# Patient Record
Sex: Female | Born: 1965 | Race: White | Hispanic: No | Marital: Married | Smoking: Former smoker
Health system: Southern US, Community
[De-identification: ages and names within clinical notes are randomized; demographics above are authoritative.]

## PROBLEM LIST (undated history)

## (undated) DIAGNOSIS — J302 Other seasonal allergic rhinitis: Secondary | ICD-10-CM

## (undated) DIAGNOSIS — S32000A Wedge compression fracture of unspecified lumbar vertebra, initial encounter for closed fracture: Secondary | ICD-10-CM

## (undated) HISTORY — PX: WISDOM TOOTH EXTRACTION: SHX21

## (undated) HISTORY — DX: Wedge compression fracture of unspecified lumbar vertebra, initial encounter for closed fracture: S32.000A

## (undated) HISTORY — DX: Other seasonal allergic rhinitis: J30.2

---

## 2005-07-11 ENCOUNTER — Other Ambulatory Visit: Admission: RE | Admit: 2005-07-11 | Discharge: 2005-07-11 | Payer: Self-pay | Admitting: Obstetrics and Gynecology

## 2005-07-19 ENCOUNTER — Ambulatory Visit (HOSPITAL_COMMUNITY): Admission: RE | Admit: 2005-07-19 | Discharge: 2005-07-19 | Payer: Self-pay | Admitting: Obstetrics and Gynecology

## 2005-07-19 ENCOUNTER — Encounter (INDEPENDENT_AMBULATORY_CARE_PROVIDER_SITE_OTHER): Payer: Self-pay | Admitting: *Deleted

## 2005-12-29 ENCOUNTER — Ambulatory Visit (HOSPITAL_COMMUNITY): Admission: RE | Admit: 2005-12-29 | Discharge: 2005-12-29 | Payer: Self-pay | Admitting: Obstetrics and Gynecology

## 2006-02-23 ENCOUNTER — Ambulatory Visit (HOSPITAL_COMMUNITY): Admission: RE | Admit: 2006-02-23 | Discharge: 2006-02-23 | Payer: Self-pay | Admitting: Obstetrics

## 2006-03-27 ENCOUNTER — Inpatient Hospital Stay (HOSPITAL_COMMUNITY): Admission: AD | Admit: 2006-03-27 | Discharge: 2006-03-27 | Payer: Self-pay | Admitting: Obstetrics and Gynecology

## 2006-05-25 ENCOUNTER — Inpatient Hospital Stay (HOSPITAL_COMMUNITY): Admission: AD | Admit: 2006-05-25 | Discharge: 2006-05-26 | Payer: Self-pay | Admitting: Obstetrics and Gynecology

## 2006-06-07 ENCOUNTER — Encounter (INDEPENDENT_AMBULATORY_CARE_PROVIDER_SITE_OTHER): Payer: Self-pay | Admitting: Obstetrics and Gynecology

## 2006-06-07 ENCOUNTER — Inpatient Hospital Stay (HOSPITAL_COMMUNITY): Admission: AD | Admit: 2006-06-07 | Discharge: 2006-06-10 | Payer: Self-pay | Admitting: Obstetrics and Gynecology

## 2006-07-19 ENCOUNTER — Other Ambulatory Visit: Admission: RE | Admit: 2006-07-19 | Discharge: 2006-07-19 | Payer: Self-pay | Admitting: Obstetrics and Gynecology

## 2007-03-28 ENCOUNTER — Observation Stay (HOSPITAL_COMMUNITY): Admission: AD | Admit: 2007-03-28 | Discharge: 2007-03-29 | Payer: Self-pay | Admitting: Unknown Physician Specialty

## 2007-04-23 ENCOUNTER — Encounter (INDEPENDENT_AMBULATORY_CARE_PROVIDER_SITE_OTHER): Payer: Self-pay | Admitting: Obstetrics and Gynecology

## 2007-04-23 ENCOUNTER — Inpatient Hospital Stay (HOSPITAL_COMMUNITY): Admission: AD | Admit: 2007-04-23 | Discharge: 2007-04-26 | Payer: Self-pay | Admitting: Obstetrics and Gynecology

## 2007-06-04 HISTORY — PX: ESSURE TUBAL LIGATION: SUR464

## 2008-08-02 ENCOUNTER — Emergency Department (HOSPITAL_COMMUNITY): Admission: EM | Admit: 2008-08-02 | Discharge: 2008-08-02 | Payer: Self-pay | Admitting: Emergency Medicine

## 2010-01-24 ENCOUNTER — Encounter: Payer: Self-pay | Admitting: Obstetrics and Gynecology

## 2010-04-11 LAB — POCT URINALYSIS DIP (DEVICE)
Ketones, ur: 80 mg/dL — AB
Protein, ur: 100 mg/dL — AB
Specific Gravity, Urine: 1.015 (ref 1.005–1.030)
Urobilinogen, UA: >=8 mg/dL (ref 0.0–1.0)
pH: 7.5 (ref 5.0–8.0)

## 2010-05-18 NOTE — Discharge Summary (Signed)
Nancy Stevenson, Nancy Stevenson                ACCOUNT NO.:  [id number]   MEDICAL RECORD NO.:  [id number]          PATIENT TYPE:  INP   LOCATION:  9106                          FACILITY:  [facility]   PHYSICIAN:  James A. Ashley Royalty, M.D.DATE OF BIRTH:  May 16, 1965   DATE OF ADMISSION:  06/07/2006  DATE OF DISCHARGE:  06/10/2006                               DISCHARGE SUMMARY   DISCHARGE DIAGNOSIS:  1. Intrauterine pregnancy at 37 weeks 5 days gestation, delivered.  2. Previous cesarean section x2.  3. Premature rupture of membranes.   OPERATIONS SPECIAL PROCEDURES:  Repeat low transverse cesarean section.   CONSULTATIONS:  None.   DISCHARGE MEDICATIONS:  Motrin 60 mg.   HISTORY AND PHYSICAL:  This is a 40-year gravida 3, para 1 at 43 weeks'  gestation.  Prenatal care was complicated by advanced maternal age for  which the patient had an amniocentesis which revealed a normal fetus  chromosomally.  The patient also had a history of previous cesarean  section x2.  She presented on the day of admission complaining of  ruptured membranes at 6:50 a.m.  Contractions ensued.  Vaginal  examination per R.N. revealed 1 cm dilatation, 80% effacement, -1  station, vertex presentation.   HISTORY AND PHYSICAL:  Please see chart.   HOSPITAL COURSE:  The patient was admitted to [facility]  [place].  Admission laboratory studies were drawn.  She was taken to  the operating room on June 07, 2006 and underwent repeat low transverse  cesarean section.  The procedure was performed by Dr. Sylvester Harder and  yielded 7 pounds 4 ounces female Apgars 9 at 1 minute and 10 at 5 minutes  sent to newborn nursery.  There were no complications.  The patient's  postoperative course was characterized by an asymptomatic anemia.  There  was also noted a small amount of serous drainage from the incision with  no overt infection or other complication was observed.  On June 7, the  patient was felt to be a candidate for  discharge and was discharged home  afebrile and in satisfactory condition on the aforementioned medication.   DISPOSITION:  The patient is to return to [facility]  in 6 weeks for postpartum evaluation.      James A. Ashley Royalty, M.D.  Electronically Signed     JAM/MEDQ  D:  07/12/2006  T:  07/13/2006  Job:  [id number]

## 2010-05-18 NOTE — Op Note (Signed)
Nancy Stevenson, Nancy Stevenson                ACCOUNT NO.:  [id number]   MEDICAL RECORD NO.:  [id number]          PATIENT TYPE:  INP   LOCATION:  9106                          [facility]  [facility]   PHYSICIAN:  James A. Ashley Royalty, M.D.DATE OF BIRTH:  1965/05/02   DATE OF PROCEDURE:  06/07/2006  DATE OF DISCHARGE:                               OPERATIVE REPORT   PREOPERATIVE DIAGNOSES:  1. Intrauterine pregnancy at 37 plus weeks' gestation.  2. Premature rupture of membranes.  3. Labor.  4. Previous cesarean section.   POSTOPERATIVE DIAGNOSES:  1. Intrauterine pregnancy at 37 plus weeks' gestation.  2. Premature rupture of membranes.  3. Labor.  4. Previous cesarean section.   PROCEDURE:  Repeat low transverse Cesarean section.   SURGEON:  Rudy Jew. Ashley Royalty, M.D.   ANESTHESIA:  Spinal.   FINDINGS:  A 7-pound 4-ounce female, Apgars 9 at one minute and 10 at five  minutes, sent to the newborn nursery.   ESTIMATED BLOOD LOSS:  900 mL.   COMPLICATIONS:  None.   PACKS AND DRAINS:  Foley.   Sponge, needle and instrument count were reported as correct x2.   PROCEDURE:  The patient was taken to the operating room and placed in  the sitting position. After spinal anesthetic was administered, she was  placed in the dorsal supine position and prepped and draped in the usual  manner for abdominal surgery. Foley catheter was placed. A Pfannenstiel  incision was made down to the level of the fascia which was nicked with  a knife and incised transversely with Mayo scissors. The underlying  rectus muscles were separated from the fascia using sharp and blunt  dissection. The rectus muscles were separated in the midline, exposing  the peritoneum which was elevated with hemostats and entered  atraumatically with Metzenbaum scissors. Incision was extended  longitudinally. The uterus was identified and a bladder flap created by  incising the anterior uterine serosa and sharply and bluntly dissecting  the  bladder inferiorly. It was held in place with a bladder blade. The  uterus was then entered through a long transverse incision using sharp  and blunt dissection. There was copious amounts of amniotic fluid. It  was noted to be clear. The infant was delivered from a vertex  presentation in an atraumatic manner. The infant was suctioned. The cord  was clamped, cut, and the infant given immediately to the waiting  pediatrics team. Cord blood was obtained, and the membranes were removed  in their entirety. The uterus was exteriorized.   The uterus was then closed with 2 running layers of #1 Vicryl. The first  was a running locking layer. The second was a running, intermittent  locking and imbricating layer. Two additional figure-of-eight sutures  were required to obtain hemostasis. Hemostasis was noted. Uterus, tubes  and ovaries were inspected and found to be otherwise normal and returned  to the abdominal cavity. Irrigation was accomplished. Hemostasis was  once again noted.   The peritoneum was then closed with 3-0 Vicryl in a running fashion. The  fascia was closed with 0 Vicryl in a running  fashion. The skin was  closed with staples.   The patient tolerated the procedure extremely well and was returned to  the recovery room in good condition. At the conclusion of the procedure,  the urine was clear and copious.      James A. Ashley Royalty, M.D.  Electronically Signed     JAM/MEDQ  D:  06/08/2006  T:  06/08/2006  Job:  [id number]

## 2010-05-18 NOTE — Discharge Summary (Signed)
Nancy Stevenson, Nancy Stevenson                ACCOUNT NO.:  [id number]   MEDICAL RECORD NO.:  [id number]          PATIENT TYPE:  INP   LOCATION:  9124                          [facility]ILITY[facility]   PHYSICIAN:  Zenaida Niece, M.D.DATE OF BIRTH:  06/09/1965   DATE OF ADMISSION:  04/23/2007  DATE OF DISCHARGE:  04/26/2007                               DISCHARGE SUMMARY   ADMISSION DIAGNOSES:  Intrauterine pregnancy at 35+ weeks, possible  labor, vaginal bleeding, and transverse presentation, previous cesarean  section x3.   DISCHARGE DIAGNOSES:  Intrauterine pregnancy at 35+ weeks, possible  labor, vaginal bleeding, and transverse presentation, previous cesarean  section x3 with possible abruption.   PROCEDURE:  On 04/23/2007, she had a repeat low-transverse cesarean  section.   HISTORY AND PHYSICAL:  This is a 45 year old white female gravida 4,  para 2-1-0-2 with an EGA of 35+ weeks, who presents with a complaint of  vaginal bleeding and contractions.  Over several hours in maternal  admission, baby remained reassuring, but she continued to have fairly  regular contractions and persistent vaginal bleeding.  Prenatal care  complicated by late care at our office.  She was admitted at 32 weeks  for vaginal bleeding and she has 3 previous cesarean sections.   Prenatal labs, blood type is O+ with negative antibody screen, rubella  immune, hepatitis B, surface antigen negative, RPR nonreactive, and HIV  negative.   OB HISTORY:  Three cesarean sections.  She had twins in 1989 and repeat  cesarean sections in 1993 and 2000.   GYN HISTORY:  History of abnormal Pap smear with normal colposcopy.   MEDICAL HISTORY:  Fractured foot.   SOCIAL HISTORY:  She is a smoker and is divorced.   PHYSICAL EXAMINATION:  She is afebrile with stable vital signs.  Fetal  heart tracing is reactive with contractions every 2-3 minutes.  Abdomen  is gravid, nontender with a transverse scar.  Cervix is 1+ and 80 and  high, and there is frank blood.   HOSPITAL COURSE:  After several hours of evaluation in triage, she  continued to contract and bleed.  She was thus taken to the operating  room and on May 01, 2007,  had a repeat cesarean section done under  spinal anesthesia.  She delivered a viable female infant with Apgars of 7  and 9, weight 6 pounds 2 ounces.  Arterial cord pH was 7.22.  The baby  was in transverse lie and there was some clot at the placental edge.  Estimated blood loss was 800 mL.  Postpartum, she had no significant  complications.  Pre delivery hemoglobin was 10, postdelivery is 8.7.  On  postoperative #3, she was felt to be stable enough for discharge home.  Her incision is healing well and on prior to discharge her staples were  to be removed and Steri-Strips applied.   DISCHARGE INSTRUCTIONS:  Regular diet.  Pelvic rest.  No strenuous  activity.  Followup is in 2 weeks for an incision check.  Medications  are over-the-counter ibuprofen as needed and she is given our discharge  pamphlet.  Zenaida Niece, M.D.  Electronically Signed     TDM/MEDQ  D:  04/26/2007  T:  04/26/2007  Job:  [id number]

## 2010-05-18 NOTE — Op Note (Signed)
NAMEARNIE, Nancy Stevenson                ACCOUNT NO.:  [id number]   MEDICAL RECORD NO.:  [id number]          PATIENT TYPE:  INP   LOCATION:  9199                          [facility]   PHYSICIAN:  Zenaida Niece, M.D.DATE OF BIRTH:  1965/09/17   DATE OF PROCEDURE:  04/23/2007  DATE OF DISCHARGE:                               OPERATIVE REPORT   PREOPERATIVE DIAGNOSES:  1. Intrauterine pregnancy at 35+ weeks.  2. Previous cesarean section x3.  3. Third trimester bleeding and contractions.  4. Transverse lie.   POSTOPERATIVE DIAGNOSES:  1. Intrauterine pregnancy at 35+ weeks.  2. Previous cesarean section x3.  3. Third trimester bleeding and contractions.  4. Transverse lie.  5. Possible abruption.   PROCEDURE:  Repeat low transverse cesarean section.   SURGEON:  Zenaida Niece, M.D.   ANESTHESIA:  Spinal.   FINDINGS:  The patient had normal uterus, tubes and ovaries.  The baby  was in a transverse lie.  She delivered a viable female infant with Apgars  of 7 and 9 that weighed 6 pounds 2 ounces.  Arterial cord pH was 7.22.   SPECIMENS:  Placenta sent to pathology.   ESTIMATED BLOOD LOSS:  800 mL.   COMPLICATIONS:  None.   PROCEDURE IN DETAIL:  The patient was taken to the operating room and  placed in the sitting position.  A [employer] student with Dr. Tacy Dura  supervising instilled spinal anesthesia.  She was then placed in the  dorsal supine position with left lateral tilt.  Abdomen was prepped and  draped in the usual sterile fashion and a Foley catheter was inserted.  The level of her anesthesia was found to be adequate.  On abdominal  exam, she had two transverse scars.  Abdomen was entered via a standard  Pfannenstiel's incision through the more superior of her scars.  Once  the peritoneal cavity was entered, the Alexis disposable self-retaining  retractor was placed.  The lower uterine segment was well exposed.  The  baby was palpated in a transverse position.  A 4  cm transverse incision  was made in the more superior portion of the lower uterine segment.  Once the uterine cavity was entered, the incision was extended  bilaterally digitally.  Membranes were ruptured revealing copious clear  fluid.  The fetal breech was noted to be on the patient's left and I was  able to bring first one-leg to the incision and eventually with  rotation, the other leg to the incision.  The baby was then delivered in  a breech manner.  Arms were swept across the chest and the head easily  delivered.  Cord was doubly clamped and cut and the infant handed to the  awaiting pediatric team.  Cord blood and arterial cord pH were obtained  and the placenta delivered manually.  Uterus was wiped dry with a clean  lap pad and all clots and debris removed.  Uterine incision was  inspected and found to be free of extensions.  Uterine incision was then  closed in one layer being a running locking layer with #1  chromic.  The  suture broke just before finishing the angle on the patient's right  side.  Another suture of #1 chromic was started at the right angle and  came to this previous suture and these were tied in the midline.  Uterine incision was inspected and found to be hemostatic.  Tubes and  ovaries were inspected and found to be normal and all blood and debris  was removed from the paracolic gutters.  Uterine incision was then  inspected one more time and found to be hemostatic.  The retractor was  removed.  A small omental adhesion was taken down with electrocautery.  Subfascial space was irrigated and made hemostatic with electrocautery.  Fascia was then closed in running fashion starting at both ends and  meeting in the middle with 0 Vicryl.  Subcutaneous tissue was then  irrigated and made hemostatic with electrocautery and the lower edge was  freed from adhesions.  The subcutaneous tissue was then closed with  running 2-0 plain gut suture followed by the skin being  closed with  staples and a sterile dressing.  The patient tolerated the procedure  well.  She was taken to the recovery room in stable condition.  Counts  were correct x2 and she received Ancef 1 gram IV at the beginning of the  procedure.      Zenaida Niece, M.D.  Electronically Signed     TDM/MEDQ  D:  04/23/2007  T:  04/23/2007  Job:  [id number]

## 2010-05-18 NOTE — H&P (Signed)
Nancy Stevenson, KUMP                ACCOUNT NO.:  [id number]   MEDICAL RECORD NO.:  [id number]          PATIENT TYPE:  INP   LOCATION:  9157                          [facility]   PHYSICIAN:  Zenaida Niece, M.D.DATE OF BIRTH:  July 21, 1965   DATE OF ADMISSION:  03/28/2007  DATE OF DISCHARGE:                              HISTORY & PHYSICAL   CHIEF COMPLAINT:  Vaginal bleeding.   HISTORY OF PRESENT ILLNESS:  This is a 45 year old white female, gravida  4, para 2-1-0-4 with an EGA of [redacted] weeks by a 29-week ultrasound with a  due date of May 20 who presents with a complaint of vaginal bleeding  since this morning.  She had late prenatal care with this pregnancy, for  which she was first seen on March 9.  At that time, she did state that  she had been bleeding since 3 o'clock that day and spotting off and on  throughout the pregnancy.  Exam at that time just revealed dark blood.  Prenatal care that has been provided has been otherwise uncomplicated.  The patient again complains of bright red bleeding since the day of  admission.  Initial evaluation in triage included a speculum exam by the  nurse practitioner which revealed some bright red blood and a small  clot.  Cervix appeared closed.  Ultrasound does not explain the  bleeding.  The patient is being admitted for observation as she is also  having some contractions.   PAST OBSTETRICAL HISTORY:  In 1989 she had a C-section at 34 weeks for  twins, in 1993 a repeat cesarean section and she had hypertension.  In  2008 another repeat cesarean section at term.  She did have preterm  contractions with that pregnancy.   GYNECOLOGIC HISTORY:  Regular periods.  No significant problems.   PAST MEDICAL HISTORY:  Essentially negative except for a fractured foot.   PAST SURGICAL HISTORY:  Negative.   ALLERGIES:  PENICILLIN CAUSES A RASH AND FEVER.   MEDICATIONS:  None.   SOCIAL HISTORY:  She is divorced and denies alcohol, tobacco or  drug  use.   REVIEW OF SYSTEMS:  Otherwise negative.   FAMILY HISTORY:  Noncontributory.   PHYSICAL EXAMINATION:  GENERAL:  This is a gravid white female in no  acute distress.  VITAL SIGNS:  Weight is approximately 200 pounds.  She is afebrile with  stable vital signs.  Fetal heart tracing is reactive with irregular  contractions.  LUNGS:  Clear to auscultation.  HEART:  Regular rate and rhythm without murmur.  ABDOMEN:  Soft, gravid, nontender.  EXTREMITIES:  Have no significant edema and are nontender.  PELVIC:  Again, on speculum exam there was some bright red bleeding.  Digital  cervical exam was not performed.   Ultrasound reveals appropriate growth with an estimated fetal weight of  2149 grams, upper normal amniotic fluid with an AFI of 22.  Fetus is in  the transverse position.  The placenta is anterior and above the  cervical os, and cervix measures 2.9 cm and there are no visible clots.   ASSESSMENT:  Intrauterine pregnancy at 32 weeks with vaginal bleeding of  unknown etiology.  The patient is also experiencing some contractions.  She has continued to bleed lightly while being evaluated in triage.  The  plan is to admit the patient for observation to monitor her bleeding.  We will use Procardia 20 mg p.o. q.6 h due to her contractions and  continuous fetal monitoring and just see what happens with her bleeding  and make sure the baby tolerates this.      Zenaida Niece, M.D.  Electronically Signed     TDM/MEDQ  D:  03/28/2007  T:  03/28/2007  Job:  [id number]

## 2010-05-21 NOTE — Op Note (Signed)
Nancy Stevenson, Nancy Stevenson                ACCOUNT NO.:  [id number]   MEDICAL RECORD NO.:  [id number]          PATIENT TYPE:  AMB   LOCATION:  SDC                           [facility]   PHYSICIAN:  James A. Ashley Royalty, M.D.DATE OF BIRTH:  [birth year]   DATE OF PROCEDURE:  07/19/2005  DATE OF DISCHARGE:                                 OPERATIVE REPORT   PREOPERATIVE DIAGNOSES:  1.  Abnormal uterine bleeding.  2.  Cervical stenosis.   POSTOPERATIVE DIAGNOSIS:  1.  Endocervical fibroid.  Pathology pending.  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   PROCEDURE:  1.  Diagnostic/operative hysteroscopy.  2.  Cervical myomectomy.  3.  Dilatation and curettage.   SURGEON:  Rudy Jew. Ashley Royalty, M.D.   ANESTHESIA:  General and paracervical (10 mL 1% Xylocaine).   ESTIMATED BLOOD LOSS:  Less than 50 mL.   COMPLICATIONS:  None.   PACKS AND DRAINS:  None.   PROCEDURE:  The patient was taken to the operating room, placed in the  dorsal supine position.  After general anesthesia was administered, she was  placed in the lithotomy position and prepped in the usual manner for vaginal  surgery.  Anterior lip of cervix was grasped with a single-tooth tenaculum.  An attempt was made to sound the uterus.  It was initially noted to be  approximately 7 cm in depth and noted to be retroflexed.  The operator  suspected it was actually deeper than that and additional dilatation  required.  The cervix was serially dilated to a size 29-French using Shawnie Pons  dilators.  Attempt was made to pass the hysteroscope.  Again, the  retroflexion did not permit complete entry into the upper uterine cavity.  The cervix was dilated to a size 33-French using Pratt dilators.  At this  time, the hysteroscope was successfully advanced into the upper uterine  cavity.  At this time, the uterus sounded a full 10 cm and was noted to be  significantly retroflexed.  Upon visualization of the upper uterine cavity,  the tubal ostia  were visualized bilaterally.  In the midline, there appeared  to be a shelf of tissue consistent with either a subseptate uterus or a  bicornuate uterus.  It did not have the globular appearance of a submucosal  fibroid.  The left tubal ostia was noted to be somewhat recessed lateral to  this midline protuberance more so than the right.  Appropriate photos were  obtained.  There were no other polyps or fibroids noted within the upper  uterine cavity.  As the hysteroscope was withdrawn, there was a endocervical  fibroid noted in the posterolateral aspect of the cervix on the right side.  Using resectoscope with the cutting waveform at 130 watts, this fibroid was  enucleated and submitted pathology for histologic studies.  Hemostasis was  obtained with the coagulation waveform at 50 watts power.  Of course, it  could not be guaranteed there was not additional tissue consistent with  fibroid beneath the endocervical surface.  Nonetheless, the fibroid was  removed to the extent the contour was flush  with remainder of the  endocervix.  Hemostasis was noted.   Attention was then turned to the uterine curettage.  A medium size curette  was introduced into the uterine cavity and uterine curettage accomplished.  First, a four quadrant technique was used.  Then, therapeutic technique was  used.  All curettings submitted to pathology for histologic studies.   At this point, the patient was felt to have benefited maximally from the  surgical procedure.  The vaginal instruments were removed.  Hemostasis noted  and procedure terminated.   The patient was taken to the recovery room in excellent condition.      James A. Ashley Royalty, M.D.  Electronically Signed     JAM/MEDQ  D:  07/19/2005  T:  07/19/2005  Job:  [id number]

## 2010-09-27 LAB — CBC
HCT: 31.6 — ABNORMAL LOW
MCHC: 35.3
MCV: 93.5
MCV: 94.3
Platelets: 190
Platelets: 237
RBC: 3.35 — ABNORMAL LOW
RDW: 13.2
WBC: 9.7

## 2010-09-27 LAB — TYPE AND SCREEN: ABO/RH(D): O POS

## 2010-09-28 LAB — CBC
HCT: 24.2 — ABNORMAL LOW
HCT: 28.4 — ABNORMAL LOW
Hemoglobin: 8.7 — ABNORMAL LOW
MCHC: 35.3
MCV: 93.4
Platelets: 202
RBC: 2.59 — ABNORMAL LOW
RBC: 3.04 — ABNORMAL LOW
RDW: 13.4

## 2010-10-21 LAB — CBC
Hemoglobin: 10.6 — ABNORMAL LOW
Hemoglobin: 8.5 — ABNORMAL LOW
MCHC: 34.2
MCHC: 34.3
MCV: 93.3
RBC: 2.68 — ABNORMAL LOW
RBC: 2.77 — ABNORMAL LOW
RBC: 3.36 — ABNORMAL LOW
WBC: 13.5 — ABNORMAL HIGH
WBC: 8.5

## 2010-10-21 LAB — TYPE AND SCREEN
ABO/RH(D): O POS
Antibody Screen: NEGATIVE

## 2011-02-26 ENCOUNTER — Encounter (HOSPITAL_COMMUNITY): Payer: Self-pay

## 2011-02-26 ENCOUNTER — Emergency Department (HOSPITAL_COMMUNITY)
Admission: EM | Admit: 2011-02-26 | Discharge: 2011-02-26 | Disposition: A | Discharge status: Home / Self Care | Payer: Self-pay | Source: Home / Self Care | Attending: Emergency Medicine | Admitting: Emergency Medicine

## 2011-02-26 DIAGNOSIS — J069 Acute upper respiratory infection, unspecified: Secondary | ICD-10-CM

## 2011-02-26 MED ORDER — AZITHROMYCIN 250 MG PO TABS: ORAL_TABLET | ORAL | Status: AC

## 2011-02-26 MED ORDER — GUAIFENESIN-CODEINE 100-10 MG/5ML PO SYRP: 5.0000 mL | ORAL_SOLUTION | Freq: Three times a day (TID) | ORAL | Status: AC | PRN

## 2011-02-26 NOTE — ED Provider Notes (Signed)
Rochele Lueck is a 46 y.o. female who presents to Urgent Care today for one week of fevers chills cough and congestion. Was improving however Wednesday worsened. Worst symptom is nighttime cough that keeps her up at night.  She denies any dyspnea wheeze chest pain palpitations edema or vomiting.  She has tried ibuprofen Robitussin and Vicks.   PMH reviewed. Quit smoking one year ago ROS as above otherwise neg Medications reviewed. No current facility-administered medications for this encounter.   Current Outpatient Prescriptions  Medication Sig Dispense Refill  . dextromethorphan 15 MG/5ML syrup Take 10 mLs by mouth 4 (four) times daily as needed.      Marland Kitchen azithromycin (ZITHROMAX Z-PAK) 250 MG tablet 2 pills day 1 and 1 pill po days 2-5  6 each  0  . guaiFENesin-codeine (ROBITUSSIN AC) 100-10 MG/5ML syrup Take 5 mLs by mouth 3 (three) times daily as needed for cough.  120 mL  0    Exam:  BP 133/70  Pulse 92  Temp(Src) 100.4 F (38 C) (Oral)  Resp 16  SpO2 100%  LMP 01/16/2011 Gen: Well NAD HEENT: EOMI,  MMM Lungs: CTABL Nl WOB Heart: RRR no MRG Abd: NABS, NT, ND Exts: Non edematous BL  LE, warm and well perfused.   Assessment and Plan: 46 year old woman with likely viral URI with cough.  Plan for symptom management with guaifenesin codeine cough medicine Tylenol.  Additionally will prescribe azithromycin for use if symptoms do not improve within 5 days.  Discussed warning signs such as trouble breathing with patient who expresses understanding.     Clementeen Graham, MD 02/26/11 [phone]

## 2011-02-26 NOTE — ED Provider Notes (Signed)
Medical screening examination/treatment/procedure(s) were performed by a resident physician and as supervising physician I was immediately available for consultation/collaboration.  Smaran Gaus, M.D.   Perri Lamagna Charles Vaanya Shambaugh, MD 02/26/11 2049 

## 2011-02-26 NOTE — Discharge Instructions (Signed)
Thank you for coming in today. I think you likely have a cold.  Use the cough medicine at night.  If you still are feeling bad by Wednesday take the Z-pack.  If you have trouble breathing go to the ER.

## 2011-02-26 NOTE — ED Notes (Signed)
PT HAS NAUSEA, BODYACHES, AND COUGH SINCE Wednesday.

## 2011-03-04 DIAGNOSIS — S32000A Wedge compression fracture of unspecified lumbar vertebra, initial encounter for closed fracture: Secondary | ICD-10-CM

## 2011-03-04 HISTORY — DX: Wedge compression fracture of unspecified lumbar vertebra, initial encounter for closed fracture: S32.000A

## 2011-03-26 ENCOUNTER — Emergency Department (HOSPITAL_BASED_OUTPATIENT_CLINIC_OR_DEPARTMENT_OTHER)
Admission: EM | Admit: 2011-03-26 | Discharge: 2011-03-26 | Disposition: A | Discharge status: Home / Self Care | Payer: Worker's Compensation | Attending: Emergency Medicine | Admitting: Emergency Medicine

## 2011-03-26 ENCOUNTER — Emergency Department (INDEPENDENT_AMBULATORY_CARE_PROVIDER_SITE_OTHER): Payer: Worker's Compensation

## 2011-03-26 ENCOUNTER — Encounter (HOSPITAL_BASED_OUTPATIENT_CLINIC_OR_DEPARTMENT_OTHER): Payer: Self-pay | Admitting: *Deleted

## 2011-03-26 DIAGNOSIS — Y990 Civilian activity done for income or pay: Secondary | ICD-10-CM | POA: Insufficient documentation

## 2011-03-26 DIAGNOSIS — M5137 Other intervertebral disc degeneration, lumbosacral region: Secondary | ICD-10-CM

## 2011-03-26 DIAGNOSIS — M4120 Other idiopathic scoliosis, site unspecified: Secondary | ICD-10-CM

## 2011-03-26 DIAGNOSIS — M549 Dorsalgia, unspecified: Secondary | ICD-10-CM

## 2011-03-26 DIAGNOSIS — M545 Low back pain, unspecified: Secondary | ICD-10-CM | POA: Insufficient documentation

## 2011-03-26 DIAGNOSIS — T148XXA Other injury of unspecified body region, initial encounter: Secondary | ICD-10-CM

## 2011-03-26 DIAGNOSIS — X500XXA Overexertion from strenuous movement or load, initial encounter: Secondary | ICD-10-CM | POA: Insufficient documentation

## 2011-03-26 DIAGNOSIS — S335XXA Sprain of ligaments of lumbar spine, initial encounter: Secondary | ICD-10-CM | POA: Insufficient documentation

## 2011-03-26 MED ORDER — HYDROCODONE-ACETAMINOPHEN 5-500 MG PO TABS: 1.0000 | ORAL_TABLET | Freq: Four times a day (QID) | ORAL | Status: AC | PRN

## 2011-03-26 MED ORDER — CYCLOBENZAPRINE HCL 10 MG PO TABS: 5.0000 mg | ORAL_TABLET | Freq: Two times a day (BID) | ORAL | Status: AC | PRN

## 2011-03-26 NOTE — Discharge Instructions (Signed)
Muscle Strain A muscle strain (pulled muscle) happens when a muscle is over-stretched. Recovery usually takes 5 to 6 weeks.  HOME CARE   Put ice on the injured area.   Put ice in a plastic bag.   Place a towel between your skin and the bag.   Leave the ice on for 15 to 20 minutes at a time, every hour for the first 2 days.   Do not use the muscle for several days or until your doctor says you can. Do not use the muscle if you have pain.   Wrap the injured area with an elastic bandage for comfort. Do not put it on too tightly.   Only take medicine as told by your doctor.   Warm up before exercise. This helps prevent muscle strains.  GET HELP RIGHT AWAY IF:  There is increased pain or puffiness (swelling) in the affected area. MAKE SURE YOU:   Understand these instructions.   Will watch your condition.   Will get help right away if you are not doing well or get worse.  Document Released: 09/29/2007 Document Revised: 12/09/2010 Document Reviewed: 09/29/2007 [employer] Patient Information 2012 [employer]. 

## 2011-03-26 NOTE — ED Notes (Signed)
Pt states he was helping transfer a pt when he went forward. States she "pulled" something in her left side.

## 2011-03-26 NOTE — ED Provider Notes (Signed)
History     CSN: [id number]  Arrival date & time 03/26/11  1552   First MD Initiated Contact with Patient 03/26/11 1801      Chief Complaint  Patient presents with  . 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 female presenting with back pain. The history is provided by the patient. No language interpreter was used.  Back Pain  This is a new problem. The current episode started 3 to 5 hours ago. The problem occurs constantly. The problem has not changed since onset.The pain is associated with lifting heavy objects. The pain is present in the lumbar spine. The quality of the pain is described as aching. The pain does not radiate. The pain is moderate. The symptoms are aggravated by certain positions. The pain is the same all the time. Pertinent negatives include no bowel incontinence, no perianal numbness, no dysuria, no tingling and no weakness. She has tried nothing for the symptoms.    History reviewed. No pertinent past medical history.  Past Surgical History  Procedure Date  . Cesarean section     History reviewed. No pertinent family history.  History  Substance Use Topics  . Smoking status: Never Smoker   . Smokeless tobacco: Not on file  . Alcohol Use: No    OB History    Grav Para Term Preterm Abortions TAB SAB Ect Mult Living                  Review of Systems  Respiratory: Negative.   Cardiovascular: Negative.   Gastrointestinal: Negative for bowel incontinence.  Genitourinary: Negative for dysuria.  Musculoskeletal: Positive for back pain.  Neurological: Negative for tingling and weakness.    Allergies  Penicillins  Home Medications   Current Outpatient Rx  Name Route Sig Dispense  Refill  . IBUPROFEN 200 MG PO TABS Oral Take 800 mg by mouth 3 (three) times daily as needed. For pain    . ADULT MULTIVITAMIN W/MINERALS CH Oral Take 1 tablet by mouth daily.      BP 126/68  Pulse 60  Temp(Src) 98.1 F (36.7 C) (Oral)  Resp 20  Ht 5\' 9"  (1.753 m)  Wt 194 lb (87.998 kg)  BMI 28.65 kg/m2  SpO2 99%  LMP 03/03/2011  Physical Exam  Nursing note and vitals reviewed. Constitutional: She is oriented to person, place, and time.  HENT:  Head: Normocephalic and atraumatic.  Cardiovascular: Normal rate and regular rhythm.   Pulmonary/Chest: Effort normal and breath sounds normal.  Musculoskeletal: Normal range of motion. She exhibits no edema and no tenderness.  Neurological: She is alert and oriented to person, place, and time. She exhibits normal muscle tone. Coordination normal.  Skin: Skin is warm and dry.  Psychiatric: She has a normal mood and affect.    ED Course  Procedures (including critical care time)  Labs Reviewed - No data to display Dg Lumbar Spine Complete  03/26/2011  *RADIOLOGY REPORT*  Clinical Data: Back pain after moving the patient.  LUMBAR SPINE - COMPLETE 4+ VIEW  Comparison: 09/19/2003.  Findings: Mild scoliosis lumbar spine convex towards the left.  L5-S1 disc space narrowing.  Mild bilateral L4-5 and L5- S1 facet joint degenerative changes.  Prior tubal ligation.  IMPRESSION: Mild scoliosis  lumbar spine convex towards the left.  L5-S1 disc space narrowing.  Mild bilateral L4-5 and L5- S1 facet joint degenerative changes.  Original Report Authenticated By: Fuller Canada, M.D.     1. Muscle strain       MDM  Likely muscle strain:pt not having any neuro deficit:will treat symptomatically        Teressa Lower, NP 03/26/11 1942

## 2011-03-27 NOTE — ED Provider Notes (Signed)
Medical screening examination/treatment/procedure(s) were performed by non-physician practitioner and as supervising physician I was immediately available for consultation/collaboration.  Toy Baker, MD 03/27/11 2330

## 2011-05-12 ENCOUNTER — Other Ambulatory Visit (HOSPITAL_COMMUNITY): Payer: Self-pay | Admitting: Family Medicine

## 2011-05-12 DIAGNOSIS — M545 Low back pain: Secondary | ICD-10-CM

## 2011-05-18 ENCOUNTER — Ambulatory Visit (HOSPITAL_COMMUNITY)
Admission: RE | Admit: 2011-05-18 | Discharge: 2011-05-18 | Disposition: A | Discharge status: Home / Self Care | Payer: Worker's Compensation | Source: Ambulatory Visit | Attending: Family Medicine | Admitting: Family Medicine

## 2011-05-18 DIAGNOSIS — M51379 Other intervertebral disc degeneration, lumbosacral region without mention of lumbar back pain or lower extremity pain: Secondary | ICD-10-CM | POA: Insufficient documentation

## 2011-05-18 DIAGNOSIS — M5126 Other intervertebral disc displacement, lumbar region: Secondary | ICD-10-CM | POA: Insufficient documentation

## 2011-05-18 DIAGNOSIS — M5137 Other intervertebral disc degeneration, lumbosacral region: Secondary | ICD-10-CM | POA: Insufficient documentation

## 2011-05-18 DIAGNOSIS — M545 Low back pain: Secondary | ICD-10-CM

## 2011-07-21 ENCOUNTER — Other Ambulatory Visit (HOSPITAL_COMMUNITY)
Admission: RE | Admit: 2011-07-21 | Discharge: 2011-07-21 | Disposition: A | Discharge status: Home / Self Care | Payer: 59 | Source: Ambulatory Visit | Attending: Family Medicine | Admitting: Family Medicine

## 2011-07-21 ENCOUNTER — Encounter: Payer: Self-pay | Admitting: Family Medicine

## 2011-07-21 ENCOUNTER — Ambulatory Visit (INDEPENDENT_AMBULATORY_CARE_PROVIDER_SITE_OTHER): Payer: 59 | Admitting: Family Medicine

## 2011-07-21 VITALS — BP 132/80 | HR 72 | Ht 67.0 in | Wt 203.0 lb

## 2011-07-21 DIAGNOSIS — Z23 Encounter for immunization: Secondary | ICD-10-CM

## 2011-07-21 DIAGNOSIS — R5383 Other fatigue: Secondary | ICD-10-CM

## 2011-07-21 DIAGNOSIS — Z0000 Encounter for general adult medical examination without abnormal findings: Secondary | ICD-10-CM

## 2011-07-21 DIAGNOSIS — S32009A Unspecified fracture of unspecified lumbar vertebra, initial encounter for closed fracture: Secondary | ICD-10-CM

## 2011-07-21 DIAGNOSIS — Z01419 Encounter for gynecological examination (general) (routine) without abnormal findings: Secondary | ICD-10-CM | POA: Insufficient documentation

## 2011-07-21 DIAGNOSIS — E7800 Pure hypercholesterolemia, unspecified: Secondary | ICD-10-CM

## 2011-07-21 DIAGNOSIS — S32000A Wedge compression fracture of unspecified lumbar vertebra, initial encounter for closed fracture: Secondary | ICD-10-CM

## 2011-07-21 DIAGNOSIS — R5381 Other malaise: Secondary | ICD-10-CM

## 2011-07-21 LAB — COMPREHENSIVE METABOLIC PANEL
ALT: 10 U/L (ref 0–35)
Alkaline Phosphatase: 41 U/L (ref 39–117)
CO2: 25 mEq/L (ref 19–32)
Creat: 0.77 mg/dL (ref 0.50–1.10)
Sodium: 140 mEq/L (ref 135–145)
Total Bilirubin: 0.6 mg/dL (ref 0.3–1.2)

## 2011-07-21 LAB — CBC WITH DIFFERENTIAL/PLATELET
Eosinophils Absolute: 0.2 10*3/uL (ref 0.0–0.7)
Eosinophils Relative: 4 % (ref 0–5)
HCT: 39 % (ref 36.0–46.0)
Lymphocytes Relative: 26 % (ref 12–46)
Lymphs Abs: 1.3 10*3/uL (ref 0.7–4.0)
MCH: 31.2 pg (ref 26.0–34.0)
MCV: 92.2 fL (ref 78.0–100.0)
Monocytes Absolute: 0.3 10*3/uL (ref 0.1–1.0)
RBC: 4.23 MIL/uL (ref 3.87–5.11)
WBC: 5 10*3/uL (ref 4.0–10.5)

## 2011-07-21 LAB — LIPID PANEL
Cholesterol: 187 mg/dL (ref 0–200)
HDL: 55 mg/dL
LDL Cholesterol: 108 mg/dL — ABNORMAL HIGH (ref 0–99)
Total CHOL/HDL Ratio: 3.4 ratio
Triglycerides: 121 mg/dL
VLDL: 24 mg/dL (ref 0–40)

## 2011-07-21 NOTE — Progress Notes (Signed)
Chief Complaint  Patient presents with  . Annual Exam    fasting annual exam with pap. Did not do eye exam as she just had one last month and got new glasses. UA showed trace blood-pt states she just finished her cycle. Wanting to have DEXA scan done becasue if compression fracture .    Health Maintenance: There is no immunization history on file for this patient. Patient isn't sure of her last tetanus Gets flu shots yearly Last Pap smear: 4 years ago, after last child. H/o abnormal pap 20 years ago (normal biopsy, normal paps since) Last mammogram: never Last colonoscopy: never Last DEXA: never Dentist: just recently got dental insurance, past due Ophtho: 05/2011 Exercise: 3x/week at gym (only recently cleared from back injury)  Past Medical History  Diagnosis Date  . Compression fracture of lumbar vertebra 03/2011    L2  . Seasonal allergies     summer    Past Surgical History  Procedure Date  . Cesarean section     x4  . Essure tubal ligation 06/2007    Dr. Jackelyn Knife    History   Social History  . Marital Status: Significant Other    Spouse Name: N/A    Number of Children: 5  . 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 Smoking status: Former Smoker -- 0.2 packs/day for 10 years    Types: Cigarettes    Quit date: 01/07/2010  . Smokeless tobacco: Never Used  . Alcohol Use: Yes     1-2 drinks per week.  . Drug Use: No  . Sexually Active: Yes -- Female partner(s)    Birth Control/ Protection: Surgical   Other Topics Concern  . Not on file   Social History Narrative   Divorced. Engaged to [employer] 5 sons (3 from previous marriage)    Family History  Problem Relation Age of Onset  . Stroke Mother   . Hypertension Mother   . Kidney disease Mother     dialysis  . Heart disease Father 42    MI  . Hypertension Father   . Diabetes Paternal Aunt   . Cancer Neg Hx     Current outpatient  prescriptions:Calcium Carbonate-Vitamin D (CALCIUM 600+D) 600-400 MG-UNIT per tablet, Take 1 tablet by mouth daily., Disp: , Rfl: ;  Multiple Vitamin (MULITIVITAMIN WITH MINERALS) TABS, Take 1 tablet by mouth daily., Disp: , Rfl: ;  ibuprofen (ADVIL,MOTRIN) 200 MG tablet, Take 800 mg by mouth 3 (three) times daily as needed. For pain, Disp: , Rfl:   Allergies  Allergen Reactions  . Penicillins Rash and Other (See Comments)    fever   ROS:  The patient denies anorexia, fever, headaches,  vision changes, decreased hearing, ear pain, sore throat, breast concerns, chest pain, palpitations, dizziness, syncope, dyspnea on exertion, cough, swelling, nausea, vomiting, diarrhea, constipation, abdominal pain, melena, hematochezia, indigestion/heartburn, hematuria, incontinence, dysuria, irregular menstrual cycles, vaginal discharge, odor or itch, genital lesions, joint pains, numbness, tingling, weakness, tremor, suspicious skin lesions, depression, anxiety, abnormal bleeding/bruising, or enlarged lymph nodes. Lost 28 pounds prior to injury, regained 16 with injury. Occasional back pain if she overdoes it.  PHYSICAL EXAM: BP 140/74  Pulse 72  Ht 5\' 7"  (1.702 m)  Wt 203 lb (92.08 kg)  BMI 31.79 kg/m2  LMP 07/15/2011 132/80 on repeat by MD, RA General Appearance:    Alert, cooperative, no distress, appears stated age  Head:  Normocephalic, without obvious abnormality, atraumatic  Eyes:    PERRL, conjunctiva/corneas clear, EOM's intact, fundi    benign  Ears:    Normal TM's and external ear canals  Nose:   Nares normal, mucosa normal, no drainage or sinus   tenderness  Throat:   Lips, mucosa, and tongue normal; teeth and gums normal  Neck:   Supple, no lymphadenopathy;  thyroid:  no   enlargement/tenderness/nodules; no carotid   bruit or JVD  Back:    Spine nontender, no curvature, ROM normal, no CVA     tenderness  Lungs:     Clear to auscultation bilaterally without wheezes, rales or     ronchi;  respirations unlabored  Chest Wall:    No tenderness or deformity   Heart:    Regular rate and rhythm, S1 and S2 normal, no murmur, rub   or gallop  Breast Exam:    No tenderness, masses, or nipple discharge or inversion.      No axillary lymphadenopathy. Pt gives h/o some milky nipple discharge, but none present or able to be expectorated today  Abdomen:     Soft, non-tender, nondistended, normoactive bowel sounds,    no masses, no hepatosplenomegaly  Genitalia:    Normal external genitalia without lesions.  BUS and vagina normal; cervix without lesions, or cervical motion tenderness. No abnormal vaginal discharge--some thin bloody, somewhat watery discharge/mucus in vaginal vault (at end of cycle, per pt).  Uterus and adnexa not enlarged, nontender, no masses.  Pap performed  Rectal:    Normal tone, no masses or tenderness; guaiac negative stool  Extremities:   No clubbing, cyanosis or edema  Pulses:   2+ and symmetric all extremities  Skin:   Skin color, texture, turgor normal, no rashes or lesions  Lymph nodes:   Cervical, supraclavicular, and axillary nodes normal  Neurologic:   CNII-XII intact, normal strength, sensation and gait; reflexes 2+ and symmetric throughout          Psych:   Normal mood, affect, hygiene and grooming.     ASSESSMENT/PLAN:  1. Routine general medical examination at a health care facility  Cytology - PAP  2. Need for Tdap vaccination  Tdap vaccine greater than or equal to 7yo IM  3. Pure hypercholesterolemia  Lipid panel  4. Other malaise and fatigue  Comprehensive metabolic panel, CBC with Differential, Vitamin D 25 hydroxy, TSH  5. Compression fracture of lumbar vertebra  Vitamin D 25 hydroxy, DG Bone Density   Agree that a DEXA is a good idea given her h/o compression fracture with minimal trauma.  Also check vitamin D level, along with baseline labs.  Advised to set up mammogram for same place/time as her DEXA  Discussed monthly self breast exams and  yearly mammograms after the age of 80; at least 30 minutes of aerobic activity at least 5 days/week; proper sunscreen use reviewed; healthy diet, including goals of calcium and vitamin D intake and alcohol recommendations (less than or equal to 1 drink/day) reviewed; regular seatbelt use; changing batteries in smoke detectors.  Immunization recommendations discussed--TdaP given today.  Colonoscopy recommendations reviewed, age 62

## 2011-07-21 NOTE — Patient Instructions (Signed)

## 2011-07-22 ENCOUNTER — Encounter: Payer: Self-pay | Admitting: Family Medicine

## 2011-07-22 LAB — HM PAP SMEAR: HM Pap smear: NEGATIVE

## 2011-07-25 ENCOUNTER — Encounter: Payer: Self-pay | Admitting: Family Medicine

## 2011-07-26 ENCOUNTER — Encounter: Payer: Self-pay | Admitting: Internal Medicine

## 2011-07-27 ENCOUNTER — Other Ambulatory Visit: Payer: Self-pay | Admitting: Family Medicine

## 2011-07-27 DIAGNOSIS — Z1231 Encounter for screening mammogram for malignant neoplasm of breast: Secondary | ICD-10-CM

## 2011-08-17 ENCOUNTER — Ambulatory Visit
Admission: RE | Admit: 2011-08-17 | Discharge: 2011-08-17 | Disposition: A | Discharge status: Home / Self Care | Payer: 59 | Source: Ambulatory Visit | Attending: Family Medicine | Admitting: Family Medicine

## 2011-08-17 DIAGNOSIS — S32000A Wedge compression fracture of unspecified lumbar vertebra, initial encounter for closed fracture: Secondary | ICD-10-CM

## 2011-08-17 DIAGNOSIS — Z1231 Encounter for screening mammogram for malignant neoplasm of breast: Secondary | ICD-10-CM

## 2011-08-22 LAB — HM DEXA SCAN: HM Dexa Scan: NORMAL

## 2011-09-21 ENCOUNTER — Ambulatory Visit: Payer: 59 | Admitting: Family Medicine

## 2011-11-04 ENCOUNTER — Encounter: Payer: Self-pay | Admitting: *Deleted

## 2011-11-04 ENCOUNTER — Emergency Department
Admission: EM | Admit: 2011-11-04 | Discharge: 2011-11-04 | Disposition: A | Discharge status: Home / Self Care | Payer: 59 | Source: Home / Self Care

## 2011-11-04 DIAGNOSIS — J302 Other seasonal allergic rhinitis: Secondary | ICD-10-CM

## 2011-11-04 DIAGNOSIS — J069 Acute upper respiratory infection, unspecified: Secondary | ICD-10-CM

## 2011-11-04 DIAGNOSIS — R062 Wheezing: Secondary | ICD-10-CM

## 2011-11-04 DIAGNOSIS — J309 Allergic rhinitis, unspecified: Secondary | ICD-10-CM

## 2011-11-04 MED ORDER — BENZONATATE 100 MG PO CAPS: 100.0000 mg | ORAL_CAPSULE | Freq: Three times a day (TID) | ORAL | Status: DC | PRN

## 2011-11-04 MED ORDER — METHYLPREDNISOLONE ACETATE 80 MG/ML IJ SUSP
80.0000 mg | Freq: Once | INTRAMUSCULAR | Status: AC
  Administered 2011-11-04: 80 mg via INTRAMUSCULAR

## 2011-11-04 MED ORDER — AZITHROMYCIN 250 MG PO TABS: ORAL_TABLET | ORAL | Status: DC

## 2011-11-04 NOTE — ED Notes (Signed)
Pt c/o HA, cough, nasal and chest congestion and fever x 1wk. She has taken OTC Robt and sinus med with no relief.

## 2011-11-04 NOTE — ED Provider Notes (Addendum)
History     CSN: [id number]  Arrival date & time 11/04/11  1755   None     No chief complaint on file.  HPI URI Symptoms Onset: 1 week Description: rhinorrhea, nasal congestion, cough, sinus pressure  Modifying factors:  Hx/o chronic allergies   Symptoms Nasal discharge: mild Fever: no Sore throat: no Cough: mild Wheezing: mild Ear pain: no GI symptoms: no Sick contacts: yes  Red Flags  Stiff neck: no Dyspnea: no Rash: no Swallowing difficulty: no  Sinusitis Risk Factors Headache/face pain: yes  Double sickening: no tooth pain: no  Allergy Risk Factors Sneezing: no Itchy scratchy throat: no Seasonal symptoms: yes  Flu Risk Factors Headache: no muscle aches: no severe fatigue: no   Past Medical History  Diagnosis Date  . Compression fracture of lumbar vertebra 03/2011    L2  . Seasonal allergies     summer    Past Surgical History  Procedure Date  . Cesarean section     x4  . Essure tubal ligation 06/2007    Dr. Jackelyn Knife    Family History  Problem Relation Age of Onset  . Stroke Mother   . Hypertension Mother   . Kidney disease Mother     dialysis  . Heart disease Father 90    MI  . Hypertension Father   . Diabetes Paternal Aunt   . Cancer Neg Hx     History  Substance Use Topics  . Smoking status: Former Smoker -- 0.2 packs/day for 10 years    Types: Cigarettes    Quit date: 01/07/2010  . Smokeless tobacco: Never Used  . Alcohol Use: Yes     1-2 drinks per week.    OB History    Grav Para Term Preterm Abortions TAB SAB Ect Mult Living   4 4        5       Review of Systems  All other systems reviewed and are negative.    Allergies  Penicillins  Home Medications   Current Outpatient Rx  Name Route Sig Dispense Refill  . CALCIUM CARBONATE-VITAMIN D 600-400 MG-UNIT PO TABS Oral Take 1 tablet by mouth daily.    . IBUPROFEN 200 MG PO TABS Oral Take 800 mg by mouth 3 (three) times daily as needed. For pain    . ADULT  MULTIVITAMIN W/MINERALS CH Oral Take 1 tablet by mouth daily.      There were no vitals taken for this visit.  Physical Exam  Constitutional: She is oriented to person, place, and time. She appears well-developed and well-nourished.  HENT:  Head: Normocephalic and atraumatic.       +nasal erythema, rhinorrhea bilaterally, + post oropharyngeal erythema    Eyes: Conjunctivae normal are normal. Pupils are equal, round, and reactive to light.  Neck: Normal range of motion. Neck supple.  Cardiovascular: Normal rate, regular rhythm and normal heart sounds.   Pulmonary/Chest: Effort normal and breath sounds normal.  Abdominal: Soft. Bowel sounds are normal.  Musculoskeletal: Normal range of motion.  Neurological: She is alert and oriented to person, place, and time.  Skin: Skin is warm.    ED Course  Procedures (including critical care time)  Labs Reviewed - No data to display No results found.   1. URI (upper respiratory infection)   2. Seasonal allergies   3. Wheezing       MDM  Suspect underlying viral and allergic etiologies of sxs.  Solumedrol 125mg  IM x1 for wheezing component.  Discussed supportive care and infectious red flags.  Antihistamines for allergic component.  Zpak if sxs not improved in 5-7 days.     The patient and/or caregiver has been counseled thoroughly with regard to treatment plan and/or medications prescribed including dosage, schedule, interactions, rationale for use, and possible side effects and they verbalize understanding. Diagnoses and expected course of recovery discussed and will return if not improved as expected or if the condition worsens. Patient and/or caregiver verbalized understanding.             Doree Albee, MD 11/06/11 1133  Doree Albee, MD 11/06/11 1134

## 2011-11-07 ENCOUNTER — Telehealth: Payer: Self-pay

## 2011-11-07 NOTE — ED Notes (Signed)
Left a message on voice mail asking how patient is feeling and advising to call back with any questions or concerns.  

## 2011-11-10 ENCOUNTER — Encounter: Payer: Self-pay | Admitting: Family Medicine

## 2011-11-10 ENCOUNTER — Ambulatory Visit (INDEPENDENT_AMBULATORY_CARE_PROVIDER_SITE_OTHER): Payer: 59 | Admitting: Family Medicine

## 2011-11-10 VITALS — BP 130/80 | HR 76 | Temp 98.3°F | Ht 67.0 in | Wt 203.0 lb

## 2011-11-10 DIAGNOSIS — R05 Cough: Secondary | ICD-10-CM

## 2011-11-10 DIAGNOSIS — J069 Acute upper respiratory infection, unspecified: Secondary | ICD-10-CM

## 2011-11-10 MED ORDER — HYDROCOD POLST-CHLORPHEN POLST 10-8 MG/5ML PO LQCR: 5.0000 mL | Freq: Every evening | ORAL | Status: DC | PRN

## 2011-11-10 NOTE — Progress Notes (Signed)
Chief Complaint  Patient presents with  . Nasal Congestion    seen at Urgent Care Friday night, and given Z pak for URI and benzonatate for cough, isn't feeling any better.   Went to [facility] 11/1 with head congestion, sinus pain, cough.  Rx'd z-pak and benzonatate.  She seemed to get better at first, but thinks she is getting worse again.  Having recurrent congestion and cough, still feeling achey.  Denies any fevers. Cough is keeping her and her husband up at night.  Took last pill of zpak 2 days ago. Nasal mucus is yellowish, getting a little darker.  Phlegm is similar in color, mostly only gets it up first thing in the mornings.  Cough gets worse at night, hacky.  Benzonatate doesn't seem to be helping.  She tends to have allergies in the fall, but they don't seem too bad right now.  She states that decongestants make her drowsy  Past Medical History  Diagnosis Date  . Compression fracture of lumbar vertebra 03/2011    L2  . Seasonal allergies     summer   Past Surgical History  Procedure Date  . Cesarean section     x4  . Essure tubal ligation 06/2007    Dr. Jackelyn Knife  . Wisdom tooth extraction    History   Social History  . Marital Status: Significant Other    Spouse Name: N/A    Number of Children: 5  . 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 Smoking status: Former Smoker -- 0.2 packs/day for 10 years    Types: Cigarettes    Quit date: 01/07/2010  . Smokeless tobacco: Never Used  . Alcohol Use: Yes     Comment: 1-2 drinks per week.  . Drug Use: No  . Sexually Active: Yes -- Female partner(s)    Birth Control/ Protection: Surgical   Other Topics Concern  . Not on file   Social History Narrative   Divorced. Engaged to [employer] 5 sons (3 from previous marriage)   Current outpatient prescriptions:azithromycin (ZITHROMAX) 250 MG tablet, Take 2 tabs PO x 1 dose, then 1 tab PO QD x 4 days, Disp: 6 tablet,  Rfl: 0;  benzonatate (TESSALON PERLES) 100 MG capsule, Take 1 capsule (100 mg total) by mouth 3 (three) times daily as needed for cough., Disp: 20 capsule, Rfl: 0;  Calcium Carbonate-Vitamin D (CALCIUM 600+D) 600-400 MG-UNIT per tablet, Take 1 tablet by mouth daily., Disp: , Rfl:  ibuprofen (ADVIL,MOTRIN) 200 MG tablet, Take 800 mg by mouth 3 (three) times daily as needed. For pain, Disp: , Rfl: ;  Multiple Vitamin (MULITIVITAMIN WITH MINERALS) TABS, Take 1 tablet by mouth daily., Disp: , Rfl:   Allergies  Allergen Reactions  . Penicillins Rash and Other (See Comments)    fever   ROS:  Denies fevers, nausea, vomiting, diarrhea, skin rash.  Some sore throat.  No ear pain.  No abdominal pain or other concerns except per HPI  PHYSICAL EXAM: BP 130/80  Pulse 76  Temp 98.3 F (36.8 C) (Oral)  Ht 5\' 7"  (1.702 m)  Wt 203 lb (92.08 kg)  BMI 31.79 kg/m2  LMP 11/05/2011 Well developed, pleasant female, accompanied by two wild children.  She is not coughing and is in no distress HEENT:  PERRL, EOMI, conjunctiva clear.  TM's and EAC's normal.  OP clear.  Nasal mucosa mildly edematous, recent bleeding on right,  no erythema or purulence.  Sinuses nontender Neck: no lymphadenopathy, thyromegaly or mass Heart: regular rate and rhythm without murmur Lungs: clear--had some initial coarse breath sounds, which cleared with coughing.  Lungs were clear, with good air movement. No wheezes, rales or ronchi Skin: no rash  ASSESSMENT/PLAN: 1. Cough  chlorpheniramine-HYDROcodone (TUSSIONEX PENNKINETIC ER) 10-8 MG/5ML LQCR  2. URI (upper respiratory infection)     Likely has partially treated sinus infection.  Discussed that z-pak is still effective, but need to add sinus rinses, guaifenesin, decongestants.  Reviewed risks and side effects of tussionex--only to use as needed to help get some sleep, short-term.

## 2011-11-10 NOTE — Patient Instructions (Addendum)
You likely have a sinus infection that has been partly treated with the z-pak.  The antibiotics are still working in your system. I recommend that you take Mucinex (plain or DM--with cough suppressant dextromethorphan) twice daily to help loosen up the phlegm and help with the cough. I also recommend that you take Claritin D or Zyrtec D to help with the sinus and head congestion. I also recommend that you use sinus rinse kit or neti-pot at least once or twice daily. Use the new cough syrup only if needed to suppress the cough.  It will make you sleepy (and last for up to 12 hours) and should only be taken in the evenings.   Return if you develop fevers, worsening cough, shortness of breath, or other symptoms.  It might take another week for symptoms to completely resolve.

## 2012-02-03 ENCOUNTER — Ambulatory Visit: Payer: 59 | Admitting: Medical

## 2012-02-03 ENCOUNTER — Emergency Department
Admission: EM | Admit: 2012-02-03 | Discharge: 2012-02-03 | Disposition: A | Discharge status: Home / Self Care | Payer: 59 | Source: Home / Self Care | Attending: Family Medicine | Admitting: Family Medicine

## 2012-02-03 ENCOUNTER — Encounter: Payer: Self-pay | Admitting: *Deleted

## 2012-02-03 ENCOUNTER — Emergency Department (INDEPENDENT_AMBULATORY_CARE_PROVIDER_SITE_OTHER): Payer: 59

## 2012-02-03 DIAGNOSIS — M25579 Pain in unspecified ankle and joints of unspecified foot: Secondary | ICD-10-CM

## 2012-02-03 DIAGNOSIS — M25572 Pain in left ankle and joints of left foot: Secondary | ICD-10-CM

## 2012-02-03 DIAGNOSIS — S93409A Sprain of unspecified ligament of unspecified ankle, initial encounter: Secondary | ICD-10-CM

## 2012-02-03 MED ORDER — KETOROLAC TROMETHAMINE 60 MG/2ML IM SOLN
60.0000 mg | Freq: Once | INTRAMUSCULAR | Status: AC
  Administered 2012-02-03: 60 mg via INTRAMUSCULAR

## 2012-02-03 NOTE — ED Notes (Signed)
Pt c/o LT ankle pain and swelling x 2wks, denies injury. She has applied ice and ace wrap.

## 2012-02-03 NOTE — ED Provider Notes (Signed)
History     CSN: [id number]  Arrival date & time 02/03/12  1755   First MD Initiated Contact with Patient 02/03/12 1800      Chief Complaint  Patient presents with  . Ankle Pain   HPI Comments: Left ankle pain x2 weeks. Patient is unsure of etiology of pain. Pain predominantly weight-bearing as well as prolonged standing. Pain is predominantly in the lateral malleolus. The distal paresthesias or numbness. No prior history of ankle or foot trauma. Patient is a been using any medicine for pain. Patient describes pain as more of an ache without radiation. Pain is mild to moderate in severity  Patient is a 47 y.o. female presenting with ankle pain.  Ankle Pain This is a new problem. The current episode started more than 1 week ago. The problem occurs daily.    Past Medical History  Diagnosis Date  . Compression fracture of lumbar vertebra 03/2011    L2  . Seasonal allergies     summer    Past Surgical History  Procedure Date  . Cesarean section     x4  . Essure tubal ligation 06/2007    Dr. Jackelyn Knife  . Wisdom tooth extraction     Family History  Problem Relation Age of Onset  . Stroke Mother   . Hypertension Mother   . Kidney disease Mother     dialysis  . Heart disease Father 43    MI  . Hypertension Father   . Diabetes Paternal Aunt   . Cancer Neg Hx     History  Substance Use Topics  . Smoking status: Former Smoker -- 0.2 packs/day for 10 years    Types: Cigarettes    Quit date: 01/07/2010  . Smokeless tobacco: Never Used  . Alcohol Use: Yes     Comment: 1-2 drinks per week.    OB History    Grav Para Term Preterm Abortions TAB SAB Ect Mult Living   4 4        5       Review of Systems  All other systems reviewed and are negative.    Allergies  Penicillins  Home Medications   Current Outpatient Rx  Name  Route  Sig  Dispense  Refill  . AZITHROMYCIN 250 MG PO TABS      Take 2 tabs PO x 1 dose, then 1 tab PO QD x 4 days   6 tablet    0   . BENZONATATE 100 MG PO CAPS   Oral   Take 1 capsule (100 mg total) by mouth 3 (three) times daily as needed for cough.   20 capsule   0   . CALCIUM CARBONATE-VITAMIN D 600-400 MG-UNIT PO TABS   Oral   Take 1 tablet by mouth daily.         Marland Kitchen HYDROCOD POLST-CPM POLST ER 10-8 MG/5ML PO LQCR   Oral   Take 5 mLs by mouth at bedtime as needed. For cough   75 mL   0   . IBUPROFEN 200 MG PO TABS   Oral   Take 800 mg by mouth 3 (three) times daily as needed. For pain         . ADULT MULTIVITAMIN W/MINERALS CH   Oral   Take 1 tablet by mouth daily.           BP 119/72  Pulse 67  Temp 98.3 F (36.8 C) (Oral)  Resp 16  Ht 5\' 9"  (1.753 m)  Wt 201 lb (91.173 kg)  BMI 29.68 kg/m2  SpO2 98%  LMP 01/18/2012  Physical Exam  Constitutional: She appears well-developed and well-nourished.  HENT:  Head: Normocephalic and atraumatic.  Eyes: Conjunctivae normal are normal. Pupils are equal, round, and reactive to light.  Neck: Normal range of motion. Neck supple.  Cardiovascular: Normal rate and regular rhythm.   Pulmonary/Chest: Effort normal and breath sounds normal.  Abdominal: Soft.  Musculoskeletal:       Feet:  Neurological: She is alert.  Skin: Skin is warm.    ED Course  Procedures (including critical care time)  Labs Reviewed - No data to display Dg Ankle Complete Left  02/03/2012  *RADIOLOGY REPORT*  Clinical Data: Lateral malleolar pain.  No injury.  LEFT ANKLE COMPLETE - 3+ VIEW  Comparison: None.  Findings: Ankle mortise congruent.  Talar dome intact.  Calcaneal spurs incidentally noted. Anatomic alignment.  In this patient with lateral ankle pain, if there is a consideration for peroneal tendon injury, consider MRI.  IMPRESSION: Negative.   Original Report Authenticated By: Andreas Newport, M.D.      1. Ankle pain, left   2. Ankle sprain       MDM  X-rays negative for fracture. Will treat symptomatically with rice and NSAIDs. Toradol 60 mg IM  x1. Ankle brace for support. Followup with sports medicine pain persists despite treatment.    The patient and/or caregiver has been counseled thoroughly with regard to treatment plan and/or medications prescribed including dosage, schedule, interactions, rationale for use, and possible side effects and they verbalize understanding. Diagnoses and expected course of recovery discussed and will return if not improved as expected or if the condition worsens. Patient and/or caregiver verbalized understanding.             Doree Albee, MD 02/03/12 [phone]

## 2012-02-05 ENCOUNTER — Telehealth: Payer: Self-pay | Admitting: Emergency Medicine

## 2012-09-06 ENCOUNTER — Ambulatory Visit (INDEPENDENT_AMBULATORY_CARE_PROVIDER_SITE_OTHER): Payer: 59 | Admitting: Family Medicine

## 2012-09-06 ENCOUNTER — Encounter: Payer: Self-pay | Admitting: Family Medicine

## 2012-09-06 VITALS — BP 108/64 | HR 64 | Ht 67.0 in | Wt 199.0 lb

## 2012-09-06 DIAGNOSIS — Z23 Encounter for immunization: Secondary | ICD-10-CM

## 2012-09-06 DIAGNOSIS — L659 Nonscarring hair loss, unspecified: Secondary | ICD-10-CM

## 2012-09-06 DIAGNOSIS — Z0000 Encounter for general adult medical examination without abnormal findings: Secondary | ICD-10-CM

## 2012-09-06 NOTE — Patient Instructions (Addendum)

## 2012-09-06 NOTE — Progress Notes (Signed)
Chief Complaint  Patient presents with  . Annual Exam    annual exam with pap. Is not fasting as she had 1 1/2 cups of coffee this morning w/milk and sugar. No major concerns.    Nancy Stevenson is a 47 y.o. female who presents for a complete physical.  She has no specific concerns.  Since her last visit, she and Danny have split up.  She moved out, and is living with her oldest son, 2 youngest sons.  Overall, she is doing okay.  Still working things out re: custody arrangements for their two boys.  Immunization History  Administered Date(s) Administered  . Influenza Split 10/17/2011  . Tdap 07/21/2011   Last Pap smear: 07/2011. H/o abnormal pap 20 years ago (normal biopsy, normal paps since)  Last mammogram: 08/2011 Last colonoscopy: never  Last DEXA: 08/2011 Dentist: many years Ophtho: 05/2011   Exercise: stopped exercising the end of May, when she and fiance split up  Past Medical History  Diagnosis Date  . Compression fracture of lumbar vertebra 03/2011    L2  . Seasonal allergies     summer    Past Surgical History  Procedure Laterality Date  . Cesarean section      x4  . Essure tubal ligation  06/2007    Dr. Jackelyn Knife  . Wisdom tooth extraction      History   Social History  . Marital Status: Significant Other    Spouse Name: N/A    Number of Children: 5  . 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 Smoking status: Former Smoker -- 0.20 packs/day for 10 years    Types: Cigarettes    Quit date: 01/07/2010  . Smokeless tobacco: Never Used  . Alcohol Use: Yes     Comment: 0-1 drinks per week.  . Drug Use: No  . Sexual Activity: Not Currently    Partners: Male    Birth Control/ Protection: Surgical   Other Topics Concern  . Not on file   Social History Narrative   Divorced. Lives with 1 of her older sons, and 2 youngest sons.  Recently separated from fiance (father of her two youngest). 5 sons     Family History  Problem Relation Age of Onset  . Stroke Mother   . Hypertension Mother   . Kidney disease Mother     dialysis  . Heart disease Father 57    MI  . Hypertension Father   . Diabetes Paternal Aunt   . Cancer Neg Hx     Current outpatient prescriptions:Calcium Carbonate-Vitamin D (CALCIUM 600+D) 600-400 MG-UNIT per tablet, Take 1 tablet by mouth daily., Disp: , Rfl: ;  Multiple Vitamin (MULITIVITAMIN WITH MINERALS) TABS, Take 1 tablet by mouth daily., Disp: , Rfl: ;  ibuprofen (ADVIL,MOTRIN) 200 MG tablet, Take 800 mg by mouth 3 (three) times daily as needed. For pain, Disp: , Rfl:   Allergies  Allergen Reactions  . Penicillins Rash and Other (See Comments)    fever   ROS: The patient denies anorexia, fever, headaches, vision changes, decreased hearing, ear pain, sore throat, breast concerns, chest pain, palpitations, dizziness, syncope, dyspnea on exertion, cough, swelling, nausea, vomiting, diarrhea, constipation, abdominal pain, melena, hematochezia, indigestion/heartburn, hematuria, incontinence, dysuria, irregular menstrual cycles, vaginal discharge, odor or itch, genital lesions, joint pains, numbness, tingling, weakness, tremor, suspicious skin lesions, depression, anxiety, abnormal bleeding/bruising, or enlarged lymph nodes.  Lost 28 pounds  prior to injury, regained 16 with injury, had lost a lot again, but then regained after stopping exercise in the last few months. +hair loss.  No other thyroid symptoms--no cold intolerance, irreg menses, bowel changes, fatigue   PHYSICAL EXAM: BP 108/64  Pulse 64  Ht 5\' 7"  (1.702 m)  Wt 199 lb (90.266 kg)  BMI 31.16 kg/m2  LMP 08/10/2012  General Appearance:  Alert, cooperative, no distress, appears stated age   Head:  Normocephalic, without obvious abnormality, atraumatic   Eyes:  PERRL, conjunctiva/corneas clear, EOM's intact, fundi  benign   Ears:  Normal TM's and external ear canals   Nose:  Nares normal, mucosa  normal, no drainage or sinus tenderness   Throat:  Lips, mucosa, and tongue normal; +decay of teeth  Neck:  Supple, no lymphadenopathy; thyroid: no enlargement/tenderness/nodules; no carotid  bruit or JVD   Back:  Spine nontender, no curvature, ROM normal, no CVA tenderness   Lungs:  Clear to auscultation bilaterally without wheezes, rales or ronchi; respirations unlabored   Chest Wall:  No tenderness or deformity   Heart:  Regular rate and rhythm, S1 and S2 normal, no murmur, rub  or gallop   Breast Exam:  No tenderness, masses, or nipple discharge or inversion. No axillary lymphadenopathy.    Abdomen:  Soft, non-tender, nondistended, normoactive bowel sounds,  no masses, no hepatosplenomegaly   Genitalia:  Normal external genitalia without lesions. BUS and vagina normal; no cervical motion tenderness. No abnormal vaginal discharge. Uterus and adnexa not enlarged, nontender, no masses. Pap not performed   Rectal:  Normal tone, no masses or tenderness; guaiac negative stool   Extremities:  No clubbing, cyanosis or edema   Pulses:  2+ and symmetric all extremities   Skin:  Skin color, texture, turgor normal, no rashes or lesions   Lymph nodes:  Cervical, supraclavicular, and axillary nodes normal   Neurologic:  CNII-XII intact, normal strength, sensation and gait; reflexes 2+ and symmetric throughout          Psych: Normal mood, affect, hygiene and grooming.   ASSESSMENT/PLAN:  Routine general medical examination at a health care facility - Plan: Visual acuity screening, POCT Urinalysis Dipstick, Glucose, random, TSH  Need for prophylactic vaccination and inoculation against influenza - Plan: Flu Vaccine QUAD 36+ mos IM  Hair loss - Plan: TSH   Pap UTD.  No HPV testing done last year, so will be due for another pap in 2016 (3 years from last)  Check TSH given her complaint of hair loss.  No other thyroid symptoms, so suspect it will be normal.  Briefly reviewed DDx of hair  loss. Glucose--nonfasting, but is 5 hours after milk/sugar in coffee.  F/u 1 year, sooner prn

## 2012-09-10 ENCOUNTER — Telehealth: Payer: Self-pay | Admitting: Family Medicine

## 2012-09-10 NOTE — Telephone Encounter (Signed)
Advise pt to add decongestant to the regimen of claritin (ie sudafed), and if mucus is thick, can also had guaifenesin as an expectorant to thin secretions so sinuses drain easer (or can do a combo pill, like Mucinex-D).  Continue to drink plenty of fluids, continue claritin.  She can try sinus rinses (sinus rinse kit or neti-pot) to help with sinus pressure.  Let us know if she is having high fevers, or symptoms are worsening after a week, rather than improving.  Normal course for a viral URI is feeling pretty bad for the first 3-5 days, then by 7-10 days, getting better, and no treatment other than supportive measures.  If it is more from allergies, rather than the start of a cold, then symptoms can last longer, but not be worsening, and needs OV for exam to determine which meds would be appropriate.  This combo of meds can still help with symptoms of allergies, but there may be better regimens for longer term (since allergies usually last much longer than colds)

## 2012-09-10 NOTE — Telephone Encounter (Signed)
Patient advised of Dr.Knapp's recommendations and verbalized understanding. 

## 2013-10-18 ENCOUNTER — Other Ambulatory Visit: Payer: Self-pay

## 2013-11-04 ENCOUNTER — Encounter: Payer: Self-pay | Admitting: Family Medicine

## 2014-02-21 ENCOUNTER — Emergency Department (HOSPITAL_BASED_OUTPATIENT_CLINIC_OR_DEPARTMENT_OTHER)
Admission: EM | Admit: 2014-02-21 | Discharge: 2014-02-21 | Disposition: A | Discharge status: Home / Self Care | Payer: BLUE CROSS/BLUE SHIELD | Attending: Emergency Medicine | Admitting: Emergency Medicine

## 2014-02-21 ENCOUNTER — Emergency Department (HOSPITAL_BASED_OUTPATIENT_CLINIC_OR_DEPARTMENT_OTHER): Payer: BLUE CROSS/BLUE SHIELD

## 2014-02-21 ENCOUNTER — Encounter (HOSPITAL_BASED_OUTPATIENT_CLINIC_OR_DEPARTMENT_OTHER): Payer: Self-pay

## 2014-02-21 DIAGNOSIS — Z8781 Personal history of (healed) traumatic fracture: Secondary | ICD-10-CM | POA: Diagnosis not present

## 2014-02-21 DIAGNOSIS — R0602 Shortness of breath: Secondary | ICD-10-CM

## 2014-02-21 DIAGNOSIS — J40 Bronchitis, not specified as acute or chronic: Secondary | ICD-10-CM | POA: Diagnosis not present

## 2014-02-21 DIAGNOSIS — Z79899 Other long term (current) drug therapy: Secondary | ICD-10-CM | POA: Insufficient documentation

## 2014-02-21 DIAGNOSIS — Z880 Allergy status to penicillin: Secondary | ICD-10-CM | POA: Insufficient documentation

## 2014-02-21 DIAGNOSIS — R05 Cough: Secondary | ICD-10-CM | POA: Diagnosis present

## 2014-02-21 DIAGNOSIS — Z87891 Personal history of nicotine dependence: Secondary | ICD-10-CM | POA: Insufficient documentation

## 2014-02-21 LAB — BASIC METABOLIC PANEL
Anion gap: 5 (ref 5–15)
BUN: 11 mg/dL (ref 6–23)
CALCIUM: 8.5 mg/dL (ref 8.4–10.5)
CHLORIDE: 107 mmol/L (ref 96–112)
CO2: 23 mmol/L (ref 19–32)
CREATININE: 0.66 mg/dL (ref 0.50–1.10)
GFR calc non Af Amer: >90 mL/min (ref >90)
Glucose, Bld: 117 mg/dL — ABNORMAL HIGH (ref 70–99)
Potassium: 3.5 mmol/L (ref 3.5–5.1)
Sodium: 135 mmol/L (ref 135–145)

## 2014-02-21 LAB — CBC WITH DIFFERENTIAL/PLATELET
BASOS ABS: 0 10*3/uL (ref 0.0–0.1)
BASOS PCT: 0 % (ref 0–1)
EOS ABS: 0 10*3/uL (ref 0.0–0.7)
Eosinophils Relative: 0 % (ref 0–5)
HCT: 35.8 % — ABNORMAL LOW (ref 36.0–46.0)
Hemoglobin: 11.8 g/dL — ABNORMAL LOW (ref 12.0–15.0)
Lymphocytes Relative: 8 % — ABNORMAL LOW (ref 12–46)
Lymphs Abs: 1 10*3/uL (ref 0.7–4.0)
MCH: 29.4 pg (ref 26.0–34.0)
MCHC: 33 g/dL (ref 30.0–36.0)
MCV: 89.1 fL (ref 78.0–100.0)
MONOS PCT: 5 % (ref 3–12)
Monocytes Absolute: 0.7 10*3/uL (ref 0.1–1.0)
NEUTROS ABS: 11.1 10*3/uL — AB (ref 1.7–7.7)
NEUTROS PCT: 87 % — AB (ref 43–77)
PLATELETS: 325 10*3/uL (ref 150–400)
RBC: 4.02 MIL/uL (ref 3.87–5.11)
RDW: 13.2 % (ref 11.5–15.5)
WBC: 12.8 10*3/uL — AB (ref 4.0–10.5)

## 2014-02-21 LAB — D-DIMER, QUANTITATIVE: D-Dimer, Quant: <0.27 ug/mL-FEU (ref 0.00–0.48)

## 2014-02-21 MED ORDER — IPRATROPIUM BROMIDE 0.02 % IN SOLN
0.5000 mg | Freq: Once | RESPIRATORY_TRACT | Status: AC
  Administered 2014-02-21: 0.5 mg via RESPIRATORY_TRACT
  Filled 2014-02-21: qty 2.5

## 2014-02-21 MED ORDER — ALBUTEROL SULFATE HFA 108 (90 BASE) MCG/ACT IN AERS
2.0000 | INHALATION_SPRAY | RESPIRATORY_TRACT | Status: DC
  Administered 2014-02-21: 2 via RESPIRATORY_TRACT

## 2014-02-21 MED ORDER — ALBUTEROL SULFATE HFA 108 (90 BASE) MCG/ACT IN AERS
INHALATION_SPRAY | RESPIRATORY_TRACT | Status: AC
  Filled 2014-02-21: qty 6.7

## 2014-02-21 MED ORDER — METHYLPREDNISOLONE SODIUM SUCC 125 MG IJ SOLR
125.0000 mg | Freq: Once | INTRAMUSCULAR | Status: AC
  Administered 2014-02-21: 125 mg via INTRAVENOUS
  Filled 2014-02-21: qty 2

## 2014-02-21 MED ORDER — ALBUTEROL (5 MG/ML) CONTINUOUS INHALATION SOLN
10.0000 mg/h | INHALATION_SOLUTION | Freq: Once | RESPIRATORY_TRACT | Status: AC
  Administered 2014-02-21: 10 mg/h via RESPIRATORY_TRACT
  Filled 2014-02-21: qty 20

## 2014-02-21 MED ORDER — SODIUM CHLORIDE 0.9 % IV SOLN
INTRAVENOUS | Status: DC
  Administered 2014-02-21: 08:00:00 via INTRAVENOUS

## 2014-02-21 NOTE — ED Provider Notes (Signed)
CSN: [id number]     Arrival date & time 02/21/14  0702 History   None    Chief Complaint  Patient presents with  . Cough     (Consider location/radiation/quality/duration/timing/severity/associated sxs/prior Treatment) HPI Comments: Patient here complaining of Cough congestion with wheezingfor the past 5 days. Has been seen at an urgent care center and diagnosed with bronchitis and she is taking Biaxin and prednisone. She is also been using an albuterol inhaler without relief. Does have a remote history of smoking and she has not smoked for the past 5 years. She has dyspnea with exertion but denies any orthopnea. No anginal type chest pain. She is also noted a sore throat and ear pain which is improving. Symptoms persistent and are getting worse.  Patient is a 48 y.o. female presenting with cough. The history is provided by the patient and the spouse.  Cough   Past Medical History  Diagnosis Date  . Compression fracture of lumbar vertebra 03/2011    L2  . Seasonal allergies     summer   Past Surgical History  Procedure Laterality Date  . Cesarean section      x4  . Essure tubal ligation  06/2007    Dr. Jackelyn Knife  . Wisdom tooth extraction     Family History  Problem Relation Age of Onset  . Stroke Mother   . Hypertension Mother   . Kidney disease Mother     dialysis  . Heart disease Father 47    MI  . Hypertension Father   . Diabetes Paternal Aunt   . Cancer Neg Hx    History  Substance Use Topics  . Smoking status: Former Smoker -- 0.20 packs/day for 10 years    Types: Cigarettes    Quit date: 01/07/2010  . Smokeless tobacco: Never Used  . Alcohol Use: Yes     Comment: 0-1 drinks per week.   OB History    Gravida Para Term Preterm AB TAB SAB Ectopic Multiple Living   Obstetric Comments   Twins with first pregnancy     Review of Systems  Respiratory: Positive for cough.   All other systems reviewed and are negative.     Allergies   Penicillins  Home Medications   Prior to Admission medications   Medication Sig Start Date End Date Taking? Authorizing Provider  Calcium Carbonate-Vitamin D (CALCIUM 600+D) 600-400 MG-UNIT per tablet Take 1 tablet by mouth daily.    Historical Provider, MD  ibuprofen (ADVIL,MOTRIN) 200 MG tablet Take 800 mg by mouth 3 (three) times daily as needed. For pain    Historical Provider, MD  Multiple Vitamin (MULITIVITAMIN WITH MINERALS) TABS Take 1 tablet by mouth daily.    Historical Provider, MD   BP 128/73 mmHg  Pulse 67  Temp(Src) 98.1 F (36.7 C)  Resp 18  Ht  (1.753 m)  Wt 223 lb (101.152 kg)  BMI 32.92 kg/m2  SpO2 100%  LMP 01/20/2014 Physical Exam  Constitutional: She is oriented to person, place, and time. She appears well-developed and well-nourished.  Non-toxic appearance. No distress.  HENT:  Head: Normocephalic and atraumatic.  Eyes: Conjunctivae, EOM and lids are normal. Pupils are equal, round, and reactive to light.  Neck: Normal range of motion. Neck supple. No tracheal deviation present. No thyroid mass present.  Cardiovascular: Normal rate, regular rhythm and normal heart sounds.  Exam reveals no gallop.  No murmur heard. Pulmonary/Chest: Effort normal. No stridor. No respiratory distress. She has decreased breath sounds. She has wheezes. She has no rhonchi. She has no rales.  Abdominal: Soft. Normal appearance and bowel sounds are normal. She exhibits no distension. There is no tenderness. There is no rebound and no CVA tenderness.  Musculoskeletal: Normal range of motion. She exhibits no edema or tenderness.  Neurological: She is alert and oriented to person, place, and time. She has normal strength. No cranial nerve deficit or sensory deficit. GCS eye subscore is 4. GCS verbal subscore is 5. GCS motor subscore is 6.  Skin: Skin is warm and dry. No abrasion and no rash noted.  Psychiatric: She has a normal mood and affect. Her speech is normal and behavior is  normal.  Nursing note and vitals reviewed.   ED Course  Procedures (including critical care time) Labs Review Labs Reviewed  D-DIMER, QUANTITATIVE  CBC WITH DIFFERENTIAL/PLATELET  BASIC METABOLIC PANEL    Imaging Review No results found.   EKG Interpretation None      MDM   Final diagnoses:  SOB (shortness of breath)    Pt given albuterol and solumedrol and feels better--d-dimer negative and no concern for ACS. Suspect she has bronchitis    Toy BakerAnthony T Meir Elwood, MD 02/21/14 [phone]

## 2014-02-21 NOTE — ED Notes (Signed)
Neb tx in process.

## 2014-02-21 NOTE — Discharge Instructions (Signed)

## 2014-02-21 NOTE — ED Notes (Signed)
Cough, congestion and wheezing for a few days.  Recent diagnosis of bronchitis and currently being treated with PO ABX, prednisone and inhaler.  Doesn't seem to be improving.

## 2014-02-21 NOTE — ED Notes (Signed)
Pt returned from radiology.

## 2014-02-21 NOTE — ED Notes (Signed)
MD at bedside. 

## 2014-05-30 ENCOUNTER — Emergency Department (HOSPITAL_COMMUNITY): Payer: No Typology Code available for payment source

## 2014-05-30 ENCOUNTER — Emergency Department (HOSPITAL_COMMUNITY)
Admission: EM | Admit: 2014-05-30 | Discharge: 2014-05-30 | Disposition: A | Discharge status: Home / Self Care | Payer: No Typology Code available for payment source | Attending: Emergency Medicine | Admitting: Emergency Medicine

## 2014-05-30 ENCOUNTER — Encounter (HOSPITAL_COMMUNITY): Payer: Self-pay | Admitting: Emergency Medicine

## 2014-05-30 DIAGNOSIS — S32020D Wedge compression fracture of second lumbar vertebra, subsequent encounter for fracture with routine healing: Secondary | ICD-10-CM | POA: Insufficient documentation

## 2014-05-30 DIAGNOSIS — Z87891 Personal history of nicotine dependence: Secondary | ICD-10-CM | POA: Insufficient documentation

## 2014-05-30 DIAGNOSIS — Z880 Allergy status to penicillin: Secondary | ICD-10-CM | POA: Diagnosis not present

## 2014-05-30 DIAGNOSIS — Z792 Long term (current) use of antibiotics: Secondary | ICD-10-CM | POA: Insufficient documentation

## 2014-05-30 DIAGNOSIS — S3992XA Unspecified injury of lower back, initial encounter: Secondary | ICD-10-CM | POA: Insufficient documentation

## 2014-05-30 DIAGNOSIS — Z7952 Long term (current) use of systemic steroids: Secondary | ICD-10-CM | POA: Insufficient documentation

## 2014-05-30 DIAGNOSIS — Y92410 Unspecified street and highway as the place of occurrence of the external cause: Secondary | ICD-10-CM | POA: Diagnosis not present

## 2014-05-30 DIAGNOSIS — Z79899 Other long term (current) drug therapy: Secondary | ICD-10-CM | POA: Insufficient documentation

## 2014-05-30 DIAGNOSIS — Y998 Other external cause status: Secondary | ICD-10-CM | POA: Diagnosis not present

## 2014-05-30 DIAGNOSIS — Y9389 Activity, other specified: Secondary | ICD-10-CM | POA: Insufficient documentation

## 2014-05-30 MED ORDER — CYCLOBENZAPRINE HCL 10 MG PO TABS: 10.0000 mg | ORAL_TABLET | Freq: Two times a day (BID) | ORAL | Status: DC | PRN

## 2014-05-30 MED ORDER — NAPROXEN 500 MG PO TABS: 500.0000 mg | ORAL_TABLET | Freq: Two times a day (BID) | ORAL | Status: DC

## 2014-05-30 MED ORDER — IBUPROFEN 400 MG PO TABS
400.0000 mg | ORAL_TABLET | Freq: Once | ORAL | Status: AC
Start: 2014-05-30 — End: 2014-05-30
  Administered 2014-05-30: 400 mg via ORAL

## 2014-05-30 MED ORDER — IBUPROFEN 400 MG PO TABS
600.0000 mg | ORAL_TABLET | Freq: Once | ORAL | Status: DC
Start: 2014-05-30 — End: 2014-05-30
  Filled 2014-05-30: qty 2
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
## 2014-08-18 ENCOUNTER — Other Ambulatory Visit: Payer: Self-pay | Admitting: Family Medicine

## 2014-08-18 ENCOUNTER — Ambulatory Visit (INDEPENDENT_AMBULATORY_CARE_PROVIDER_SITE_OTHER): Payer: BLUE CROSS/BLUE SHIELD | Admitting: Family Medicine

## 2014-08-18 ENCOUNTER — Encounter: Payer: Self-pay | Admitting: Family Medicine

## 2014-08-18 VITALS — BP 110/68 | HR 68 | Temp 96.7°F | Ht 67.0 in | Wt 211.6 lb

## 2014-08-18 DIAGNOSIS — N6011 Diffuse cystic mastopathy of right breast: Secondary | ICD-10-CM | POA: Diagnosis not present

## 2014-08-18 DIAGNOSIS — N644 Mastodynia: Secondary | ICD-10-CM | POA: Diagnosis not present

## 2014-08-18 DIAGNOSIS — N6012 Diffuse cystic mastopathy of left breast: Secondary | ICD-10-CM

## 2014-08-18 NOTE — Progress Notes (Signed)
Chief Complaint  Patient presents with  . Breast Mass    has two breast masses, one already checked out by Dr Marchia Bond and was supposed to have testing done- 2 weeks ago but no one has ever called her back.   She first noted a breast mass 8/1--a little bump, not tender.  She saw Dr. Marchia Bond, a doctor through her job, who said it was a cyst, and also found another lump. The one that she found has been tender.  She doesn't believe that either lump has really changed much in the 2 weeks, and doesn't really notice it, not tender unless she presses on it.  No change in caffeine intake, 2 coffees each morning, and an occasional soda.  LMP 7/20, regular. She has had Essure tubal ligation. Last mammogram was 08/2011  PMH, PSH reviewed. Social History   Social History  . Marital Status: Significant Other    Spouse Name: N/A  . Number of Children: 5  . Years of Education: N/A   Occupational History  . service advisor at [employer]    Social History Main Topics  . Smoking status: Former Smoker -- 0.20 packs/day for 10 years    Types: Cigarettes    Quit date: 01/07/2010  . Smokeless tobacco: Never Used  . Alcohol Use: Yes     Comment: 0-1 drinks per week.  . Drug Use: No  . Sexual Activity:    Partners: Male    Birth Control/ Protection: Surgical   Other Topics Concern  . Not on file   Social History Narrative   Divorced. 5 sons. Lives with husband and 2 youngest sons.      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 Stroke Mother   . Hypertension Mother   . Kidney disease Mother     dialysis  . Heart disease Father 71    MI  . Hypertension Father   . Diabetes Paternal Aunt   . Cancer Neg Hx    Outpatient Encounter Prescriptions as of 08/18/2014  Medication Sig  . Calcium Carbonate-Vitamin D (CALCIUM 600+D) 600-400 MG-UNIT per tablet Take 1 tablet by mouth daily.  . Multiple Vitamin (MULITIVITAMIN WITH  MINERALS) TABS Take 1 tablet by mouth daily.  . Omega-3 Fatty Acids (FISH OIL PO) Take by mouth.  . phentermine (ADIPEX-P) 37.5 MG tablet Take 37.5 mg by mouth daily before breakfast.  . [DISCONTINUED] CLARITHROMYCIN PO Take 500 mg by mouth 2 (two) times daily.  . [DISCONTINUED] cyclobenzaprine (FLEXERIL) 10 MG tablet Take 1 tablet (10 mg total) by mouth 2 (two) times daily as needed for muscle spasms.  . [DISCONTINUED] ibuprofen (ADVIL,MOTRIN) 200 MG tablet Take 800 mg by mouth 3 (three) times daily as needed. For pain  . [DISCONTINUED] naproxen (NAPROSYN) 500 MG tablet Take 1 tablet (500 mg total) by mouth 2 (two) times daily.  . [DISCONTINUED] predniSONE (DELTASONE) 10 MG tablet Take 10 mg by mouth daily with breakfast.   No facility-administered encounter medications on file as of 08/18/2014.   Allergies  Allergen Reactions  . Penicillins Rash and Other (See Comments)    fever   ROS:  No nipple discharge, weight changes, headaches, chest pain, URI symptoms, GI or GU complaints or other concerns.  PHYSICAL EXAM: BP 110/68 mmHg  Pulse 68  Temp(Src) 96.7 F (35.9 C) (Tympanic)  Ht  (1.702 m)  Wt 211 lb 9.6 oz (95.981 kg)  BMI 33.13 kg/m2  LMP 07/23/2014 Well developed, pleasant female in no distress, accompanied by her husband.  Left breast--inferior portion of breast, in the superficial skin there is a small raised area that has a black center, slightly bluish-tinged, no induration, erythema, nontender. Consistent with superficial cyst, without infection. Breast exam:  Normal on the right, with only minimal fibroglandular changes.  On the left there are more significant fibroglandular changes, especially in the entire upper portion of the left breast.  No discrete masses. Mildly tender throughout this area. No nipple discharge, inversion or axilary lymphadenopathy  ASSESSMENT/PLAN:  Breast pain, left - Plan: MM DIAG BREAST TOMO BILATERAL, CANCELED: [facility] Digital Diagnostic  Bilat  Fibrocystic breast changes of both breasts - Plan: MM DIAG BREAST TOMO BILATERAL, CANCELED: MM Digital Diagnostic Bilat  Past due for mammogram. Set up diagnostic bilateral mammogram, to be done after her next cycle (likely may need u/s as well) Reassured about superficial cyst on skin, non-infected.  F/u for CPE

## 2014-08-18 NOTE — Patient Instructions (Signed)
Please get your mammogram--we will schedule this for hopefully after your period (your breasts are lumpier and more tender just before--ie right now--and during your cycle). Schedule a physical. Let us know if you have ongoing problems.

## 2014-08-29 ENCOUNTER — Ambulatory Visit
Admission: RE | Admit: 2014-08-29 | Discharge: 2014-08-29 | Disposition: A | Discharge status: Home / Self Care | Payer: BLUE CROSS/BLUE SHIELD | Source: Ambulatory Visit | Attending: Family Medicine | Admitting: Family Medicine

## 2014-08-29 DIAGNOSIS — N6011 Diffuse cystic mastopathy of right breast: Secondary | ICD-10-CM

## 2014-08-29 DIAGNOSIS — N644 Mastodynia: Secondary | ICD-10-CM

## 2014-08-29 DIAGNOSIS — N6012 Diffuse cystic mastopathy of left breast: Secondary | ICD-10-CM

## 2014-09-18 ENCOUNTER — Ambulatory Visit (INDEPENDENT_AMBULATORY_CARE_PROVIDER_SITE_OTHER): Payer: BLUE CROSS/BLUE SHIELD | Admitting: Family Medicine

## 2014-09-18 ENCOUNTER — Encounter: Payer: Self-pay | Admitting: Family Medicine

## 2014-09-18 ENCOUNTER — Other Ambulatory Visit (HOSPITAL_COMMUNITY)
Admission: RE | Admit: 2014-09-18 | Discharge: 2014-09-18 | Disposition: A | Discharge status: Home / Self Care | Payer: BLUE CROSS/BLUE SHIELD | Source: Ambulatory Visit | Attending: Family Medicine | Admitting: Family Medicine

## 2014-09-18 VITALS — BP 110/72 | HR 60 | Temp 98.2°F | Ht 67.0 in | Wt 213.0 lb

## 2014-09-18 DIAGNOSIS — R300 Dysuria: Secondary | ICD-10-CM | POA: Diagnosis not present

## 2014-09-18 DIAGNOSIS — Z0000 Encounter for general adult medical examination without abnormal findings: Secondary | ICD-10-CM

## 2014-09-18 DIAGNOSIS — R195 Other fecal abnormalities: Secondary | ICD-10-CM

## 2014-09-18 DIAGNOSIS — Z01419 Encounter for gynecological examination (general) (routine) without abnormal findings: Secondary | ICD-10-CM | POA: Diagnosis present

## 2014-09-18 DIAGNOSIS — R5383 Other fatigue: Secondary | ICD-10-CM

## 2014-09-18 DIAGNOSIS — K029 Dental caries, unspecified: Secondary | ICD-10-CM

## 2014-09-18 DIAGNOSIS — N3000 Acute cystitis without hematuria: Secondary | ICD-10-CM | POA: Diagnosis not present

## 2014-09-18 DIAGNOSIS — N926 Irregular menstruation, unspecified: Secondary | ICD-10-CM | POA: Diagnosis not present

## 2014-09-18 DIAGNOSIS — Z1151 Encounter for screening for human papillomavirus (HPV): Secondary | ICD-10-CM | POA: Insufficient documentation

## 2014-09-18 DIAGNOSIS — N912 Amenorrhea, unspecified: Secondary | ICD-10-CM

## 2014-09-18 DIAGNOSIS — Z23 Encounter for immunization: Secondary | ICD-10-CM | POA: Diagnosis not present

## 2014-09-18 LAB — POCT URINALYSIS DIPSTICK
Bilirubin, UA: NEGATIVE
Glucose, UA: NEGATIVE
KETONES UA: NEGATIVE
Nitrite, UA: NEGATIVE
PROTEIN UA: NEGATIVE
UROBILINOGEN UA: NEGATIVE
pH, UA: 6

## 2014-09-18 LAB — POCT URINE PREGNANCY: PREG TEST UR: NEGATIVE

## 2014-09-18 MED ORDER — SULFAMETHOXAZOLE-TRIMETHOPRIM 800-160 MG PO TABS: 1.0000 | ORAL_TABLET | Freq: Two times a day (BID) | ORAL | Status: DC

## 2014-09-18 NOTE — Patient Instructions (Addendum)
  HEALTH MAINTENANCE RECOMMENDATIONS:  It is recommended that you get at least 30 minutes of aerobic exercise at least 5 days/week (for weight loss, you may need as much as 60-90 minutes). This can be any activity that gets your heart rate up. This can be divided in 10-15 minute intervals if needed, but try and build up your endurance at least once a week.  Weight bearing exercise is also recommended twice weekly.  Eat a healthy diet with lots of vegetables, fruits and fiber.  "Colorful" foods have a lot of vitamins (ie green vegetables, tomatoes, red peppers, etc).  Limit sweet tea, regular sodas and alcoholic beverages, all of which has a lot of calories and sugar.  Up to 1 alcoholic drink daily may be beneficial for women (unless trying to lose weight, watch sugars).  Drink a lot of water.  Calcium recommendations are 1200-1500 mg daily (1500 mg for postmenopausal women or women without ovaries), and vitamin D 1000 IU daily.  This should be obtained from diet and/or supplements (vitamins), and calcium should not be taken all at once, but in divided doses.  Monthly self breast exams and yearly mammograms for women over the age of 43 is recommended.  Sunscreen of at least SPF 30 should be used on all sun-exposed parts of the skin when outside between the hours of 10 am and 4 pm (not just when at beach or pool, but even with exercise, golf, tennis, and yard work!)  Use a sunscreen that says "broad spectrum" so it covers both UVA and UVB rays, and make sure to reapply every 1-2 hours.  Remember to change the batteries in your smoke detectors when changing your clock times in the spring and fall.  Use your seat belt every time you are in a car, and please drive safely and not be distracted with cell phones and texting while driving.  Please schedule your dental and eye exams.  Take the antibiotics twice daily for bladder infection. If they bother your stomach, and your symptoms are completely better  after 3 days, you can stop at 3 days rather than taking all 5.  We will contact you with your results of the culture next week but be sure to call us if your symptoms are persistening/worsening.

## 2014-09-18 NOTE — Progress Notes (Signed)
Chief Complaint  Patient presents with  . Annual Exam    nonfasting annual exam with pap. UA showed 1+ leuks and trace blood. Is having burning with urination x 1 week. Also has not had a period since mid July.    Nancy Stevenson is a 49 y.o. female who presents for a complete physical.  She has the following concerns:  Her periods have been heavier recently, and had been regular up until this past month.  LMP mid July, didn't have one in August. Not having hot flashes or night sweats, no nausea or breast tenderness. S/p tubal ligation. Denies fatigue, weight gain, hair loss, bowel changes.  She is complaining of dysuria x 1 week--it got better for a day, but then recurred. No hematuria.  Has been drinking more water and taking cranberry pills, which had helped some.  No urgency or frequency.  Odor to the urine in the mornings only. Denies any vaginal discharge.  She had mammo and u/s of left breast last month, which confirmed sebaceous cyst and no other abnormalities.   Immunization History  Administered Date(s) Administered  . Influenza Split 10/17/2011  . Influenza,inj,Quad PF,36+ Mos 09/06/2012, 09/18/2014  . Tdap 07/21/2011   Last Pap smear: 07/2011. H/o abnormal pap 20 years ago (normal biopsy, normal paps since)  Last mammogram: 08/2014 Last colonoscopy: never  Last DEXA: 08/2011 Dentist: at least 1.5 years ago; plans to schedule Ophtho: wears glasses, due now (last 2 years ago).  Exercise:  Recently started Jujitsu and walking regularly.   Past Medical History  Diagnosis Date  . Compression fracture of lumbar vertebra 03/2011    L2  . Seasonal allergies     summer    Past Surgical History  Procedure Laterality Date  . Cesarean section      x4  . Essure tubal ligation  06/2007    Dr. Willis Modena  . Wisdom tooth extraction      Social History   Social History  . Marital Status: Significant Other    Spouse Name: N/A  . Number of Children: 5  . Years of Education:  N/A   Occupational History  . 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 Smoking status: Former Smoker -- 0.20 packs/day for 10 years    Types: Cigarettes    Quit date: 01/07/2010  . Smokeless tobacco: Never Used  . Alcohol Use: 0.0 oz/week    0 Standard drinks or equivalent per week     Comment: 0-1 drinks per week.  . Drug Use: No  . Sexual Activity:    Partners: Male    Birth Control/ Protection: Surgical   Other Topics Concern  . Not on file   Social History Narrative   Divorced. 5 sons. Lives with Kasandra Knudsen (fiance, longterm) and 2 youngest sons.      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 Stroke Mother   . Hypertension Mother   . Kidney disease Mother     dialysis  . Heart disease Father 103    MI  . Hypertension Father   . Diabetes Paternal Aunt   . Cancer Neg Hx     Outpatient Encounter Prescriptions as of 09/18/2014  Medication Sig Note  . Calcium Carbonate-Vitamin D (CALCIUM 600+D) 600-400 MG-UNIT per tablet Take 1 tablet by mouth daily.   . Multiple  Vitamin (MULITIVITAMIN WITH MINERALS) TABS Take 1 tablet by mouth daily.   . Omega-3 Fatty Acids (FISH OIL PO) Take by mouth.   . phentermine (ADIPEX-P) 37.5 MG tablet Take 37.5 mg by mouth daily before breakfast. 09/18/2014: Dr. Marianna Payment (work physician) prescribes this; started in July 2016  . sulfamethoxazole-trimethoprim (BACTRIM DS,SEPTRA DS) 800-160 MG per tablet Take 1 tablet by mouth 2 (two) times daily.    No facility-administered encounter medications on file as of 09/18/2014.    Allergies  Allergen Reactions  . Penicillins Rash and Other (See Comments)    fever   ROS: The patient denies anorexia, fever, headaches, vision changes, decreased hearing, ear pain, sore throat, breast concerns, chest pain, palpitations, dizziness, syncope, dyspnea on exertion,  cough, swelling, nausea, vomiting, diarrhea, constipation, abdominal pain, melena, hematochezia, indigestion/heartburn, hematuria, incontinence, vaginal discharge, odor or itch, genital lesions, joint pains, numbness, tingling, weakness, tremor, suspicious skin lesions, depression, anxiety, abnormal bleeding/bruising, or enlarged lymph nodes.  Some restless legs at night, per husband. He states she is up at night, but she is not aware of it.  Sometimes she gets up once a night.  She feels like she sleeps well and is well rested in the mornings. (the leg movements apparently don't interfere with her sleep)  See HPI for urinary complaints, irregular period   PHYSICAL EXAM:  BP 110/72 mmHg  Pulse 60  Temp(Src) 98.2 F (36.8 C) (Tympanic)  Ht 5' 7"  (1.702 m)  Wt 213 lb (96.616 kg)  BMI 33.35 kg/m2  LMP 07/16/2014  General Appearance:  Alert, cooperative, no distress, appears stated age   Head:  Normocephalic, without obvious abnormality, atraumatic   Eyes:  PERRL, conjunctiva/corneas clear, EOM's intact, fundi  benign   Ears:  Normal TM's and external ear canals   Nose:  Nares normal, mucosa normal, no drainage or sinus tenderness   Throat:  Lips, mucosa, and tongue normal; +decay of teeth  Neck:  Supple, no lymphadenopathy; thyroid: no enlargement/tenderness/nodules; no carotid  bruit or JVD   Back:  Spine nontender, no curvature, ROM normal, no CVA tenderness   Lungs:  Clear to auscultation bilaterally without wheezes, rales or ronchi; respirations unlabored   Chest Wall:  No tenderness or deformity   Heart:  Regular rate and rhythm, S1 and S2 normal, no murmur, rub  or gallop   Breast Exam:  No tenderness, masses, or nipple discharge or inversion. No axillary lymphadenopathy. Persistent sebaceous cyst at 7 o'clock position of left breast, nontender  Abdomen:  Soft, non-tender, nondistended, normoactive bowel sounds,  no masses, no hepatosplenomegaly    Genitalia:  Normal external genitalia without lesions. BUS and vagina normal; no cervical lesions or cervical motion tenderness. No abnormal vaginal discharge. Uterus and adnexa not enlarged, nontender, no masses. Pap performed   Rectal:  Normal tone, no masses or tenderness; guaiac positive stool   Extremities:  No clubbing, cyanosis or edema   Pulses:  2+ and symmetric all extremities   Skin:  Skin color, texture, turgor normal, no rashes or lesions   Lymph nodes:  Cervical, supraclavicular, and axillary nodes normal   Neurologic:  CNII-XII intact, normal strength, sensation and gait; reflexes 2+ and symmetric throughout    Psych: Normal mood, affect, hygiene and grooming.        ASSESSMENT/PLAN:  Annual physical exam - Plan: Visual acuity screening, POCT Urinalysis Dipstick, CBC with Differential/Platelet, Lipid panel, TSH, Glucose, random, Cytology - PAP [place]  Need for prophylactic vaccination and inoculation against influenza -  Plan: Flu Vaccine QUAD 36+ mos PF IM (Fluarix & Fluzone Quad PF)  Other fatigue - Plan: CBC with Differential/Platelet, TSH  Dysuria - Plan: Urine culture  Acute cystitis without hematuria - treat with Bactrim. risks/side effects reviewed. culture sent - Plan: sulfamethoxazole-trimethoprim (BACTRIM DS,SEPTRA DS) 800-160 MG per tablet  Irregular menses - likely perimenopausal; keep track on calendar  Absence of menstruation - Plan: POCT urine pregnancy, TSH  Heme positive stool - no h/o bleeding. Hemassure kit given---to return in 1 week (or more); may need further eval if remains + or anemia on CBC  Tooth decay - encouraged her to follow up with her dentist    Discussed monthly self breast exams and yearly mammograms after the age of 12; at least 30 minutes of aerobic activity at least 5 days/week, weight-bearing exercise at least 2x/week; proper sunscreen use reviewed; healthy diet, including goals of  calcium and vitamin D intake and alcohol recommendations (less than or equal to 1 drink/day) reviewed; regular seatbelt use; changing batteries in smoke detectors.  Immunization recommendations discussed--flu shot given.  Colonoscopy recommendations reviewed--age 18, or sooner if anemia or hemassure is positive.  Return for fasting labs. CPE 1 year

## 2014-09-19 ENCOUNTER — Encounter: Payer: Self-pay | Admitting: Family Medicine

## 2014-09-21 LAB — URINE CULTURE: Colony Count: >=100000

## 2014-09-22 LAB — CYTOLOGY - PAP

## 2014-10-20 ENCOUNTER — Emergency Department (HOSPITAL_BASED_OUTPATIENT_CLINIC_OR_DEPARTMENT_OTHER)
Admission: EM | Admit: 2014-10-20 | Discharge: 2014-10-20 | Disposition: A | Discharge status: Home / Self Care | Payer: BLUE CROSS/BLUE SHIELD | Attending: Emergency Medicine | Admitting: Emergency Medicine

## 2014-10-20 ENCOUNTER — Emergency Department (HOSPITAL_BASED_OUTPATIENT_CLINIC_OR_DEPARTMENT_OTHER): Payer: BLUE CROSS/BLUE SHIELD

## 2014-10-20 ENCOUNTER — Encounter (HOSPITAL_BASED_OUTPATIENT_CLINIC_OR_DEPARTMENT_OTHER): Payer: Self-pay

## 2014-10-20 DIAGNOSIS — M542 Cervicalgia: Secondary | ICD-10-CM | POA: Diagnosis not present

## 2014-10-20 DIAGNOSIS — Z79899 Other long term (current) drug therapy: Secondary | ICD-10-CM | POA: Diagnosis not present

## 2014-10-20 DIAGNOSIS — Z8781 Personal history of (healed) traumatic fracture: Secondary | ICD-10-CM | POA: Diagnosis not present

## 2014-10-20 DIAGNOSIS — R079 Chest pain, unspecified: Secondary | ICD-10-CM

## 2014-10-20 DIAGNOSIS — Z880 Allergy status to penicillin: Secondary | ICD-10-CM | POA: Diagnosis not present

## 2014-10-20 DIAGNOSIS — Z87891 Personal history of nicotine dependence: Secondary | ICD-10-CM | POA: Insufficient documentation

## 2014-10-20 LAB — COMPREHENSIVE METABOLIC PANEL
ALK PHOS: 56 U/L (ref 38–126)
ALT: 13 U/L — ABNORMAL LOW (ref 14–54)
ANION GAP: 4 — AB (ref 5–15)
AST: 22 U/L (ref 15–41)
Albumin: 4.2 g/dL (ref 3.5–5.0)
BILIRUBIN TOTAL: 0.5 mg/dL (ref 0.3–1.2)
BUN: 13 mg/dL (ref 6–20)
CALCIUM: 8.9 mg/dL (ref 8.9–10.3)
CO2: 29 mmol/L (ref 22–32)
Chloride: 107 mmol/L (ref 101–111)
Creatinine, Ser: 0.81 mg/dL (ref 0.44–1.00)
GFR calc non Af Amer: >60 mL/min (ref >60)
Glucose, Bld: 97 mg/dL (ref 65–99)
Potassium: 4.2 mmol/L (ref 3.5–5.1)
Sodium: 140 mmol/L (ref 135–145)
TOTAL PROTEIN: 6.9 g/dL (ref 6.5–8.1)

## 2014-10-20 LAB — CBC
HEMATOCRIT: 39 % (ref 36.0–46.0)
HEMOGLOBIN: 13 g/dL (ref 12.0–15.0)
MCH: 30.3 pg (ref 26.0–34.0)
MCHC: 33.3 g/dL (ref 30.0–36.0)
MCV: 90.9 fL (ref 78.0–100.0)
Platelets: 253 10*3/uL (ref 150–400)
RBC: 4.29 MIL/uL (ref 3.87–5.11)
RDW: 12.9 % (ref 11.5–15.5)
WBC: 7.2 10*3/uL (ref 4.0–10.5)

## 2014-10-20 LAB — TROPONIN I
Troponin I: <0.03 ng/mL (ref <0.031)
Troponin I: <0.03 ng/mL (ref <0.031)

## 2014-10-20 MED ORDER — ASPIRIN 81 MG PO CHEW
324.0000 mg | CHEWABLE_TABLET | Freq: Once | ORAL | Status: AC
  Administered 2014-10-20: 324 mg via ORAL
  Filled 2014-10-20: qty 4

## 2014-10-20 MED ORDER — GI COCKTAIL ~~LOC~~
30.0000 mL | Freq: Once | ORAL | Status: AC
  Administered 2014-10-20: 30 mL via ORAL
  Filled 2014-10-20: qty 30

## 2014-10-20 MED ORDER — OMEPRAZOLE 20 MG PO CPDR: 20.0000 mg | DELAYED_RELEASE_CAPSULE | Freq: Every day | ORAL | Status: DC

## 2014-10-20 NOTE — ED Notes (Signed)
PA at bedside.

## 2014-10-20 NOTE — ED Provider Notes (Signed)
CSN: [id number]     Arrival date & time 10/20/14  1305 History   First MD Initiated Contact with Patient 10/20/14 1442     Chief Complaint  Patient presents with  . Chest Pain     (Consider location/radiation/quality/duration/timing/severity/associated sxs/prior Treatment) HPI   Patient is a 49 year old female, otherwise healthy, who presents to the emergency room with central chest pain that is intermittent, described as a squeezing, "cramping" sensation, which began last night approximately 8 PM after eating.  She complains of posterior neck pain with radiation into her left arm and associated numbness in her fingers, which is also intermittent and began last night.  Patient had another episode of chest pain while ambulating, and also while eating breakfast, with associated nausea.  She has not tried to treat her chest pain or neck pain with anything.  She states there is some reproducibility of her chest pain with pressing on her chest wall. She denies associated diaphoresis, vomiting, abdominal pain, shortness of breath, wheeze, lower extremity edema, palpitations, syncope, dyspepsia, GERD, fever, chills, sweats. She further denies any trauma to her chest, recent URI, cough, wheeze, recent travel, periods of stasis, history of blood clots.  She does not have any history of diabetes, hypertension, smoking history.  Past Medical History  Diagnosis Date  . Compression fracture of lumbar vertebra (HCC) 03/2011    L2  . Seasonal allergies     summer   Past Surgical History  Procedure Laterality Date  . Cesarean section      x4  . Essure tubal ligation  06/2007    Dr. Jackelyn Knife  . Wisdom tooth extraction     Family History  Problem Relation Age of Onset  . Stroke Mother   . Hypertension Mother   . Kidney disease Mother     dialysis  . Heart disease Father 87    MI  . Hypertension Father   . Diabetes Paternal Aunt   . Cancer Neg Hx    Social History  Substance Use Topics  .  Smoking status: Former Smoker -- 0.20 packs/day for 10 years    Types: Cigarettes    Quit date: 01/07/2010  . Smokeless tobacco: Never Used  . Alcohol Use: 0.0 oz/week    0 Standard drinks or equivalent per week     Comment: 0-1 drinks per week.   OB History    Gravida Para Term Preterm AB TAB SAB Ectopic Multiple Living   Obstetric Comments   Twins with first pregnancy     Review of Systems  Constitutional: Negative.  Negative for fever, chills, diaphoresis, activity change, appetite change and fatigue.  HENT: Negative.   Eyes: Negative.   Respiratory: Negative.   Cardiovascular: Positive for chest pain. Negative for palpitations and leg swelling.  Gastrointestinal: Negative for vomiting, abdominal pain, diarrhea, constipation, blood in stool, abdominal distention, anal bleeding and rectal pain.  Genitourinary: Negative.   Musculoskeletal: Positive for neck pain. Negative for neck stiffness.  Skin: Negative.   Neurological: Negative for dizziness, syncope, weakness and light-headedness.      Allergies  Penicillins  Home Medications   Prior to Admission medications   Medication Sig Start Date End Date Taking? Authorizing Provider  Calcium Carbonate-Vitamin D (CALCIUM 600+D) 600-400 MG-UNIT per tablet Take 1 tablet by mouth daily.    Historical Provider, MD  Multiple Vitamin (MULITIVITAMIN WITH MINERALS) TABS Take 1 tablet by mouth daily.  Historical Provider, MD  Omega-3 Fatty Acids (FISH OIL PO) Take by mouth.    Historical Provider, MD  omeprazole (PRILOSEC) 20 MG capsule Take 1 capsule (20 mg total) by mouth daily. 10/20/14   Danelle BerryLeisa Tekila Caillouet, PA-C   BP 115/67 mmHg  Pulse 56  Temp(Src) 98.4 F (36.9 C) (Oral)  Resp 17  Ht 5\' 9"  (1.753 m)  Wt 211 lb (95.709 kg)  BMI 31.15 kg/m2  SpO2 100%  LMP  (LMP Unknown) Physical Exam  Constitutional: She is oriented to person, place, and time. She appears well-developed and well-nourished. No distress.   HENT:  Head: Normocephalic and atraumatic.  Nose: Nose normal.  Mouth/Throat: Oropharynx is clear and moist. No oropharyngeal exudate.  Eyes: Conjunctivae and EOM are normal. Pupils are equal, round, and reactive to light. Right eye exhibits no discharge. Left eye exhibits no discharge. No scleral icterus.  Neck: Normal range of motion and full passive range of motion without pain. Neck supple. No JVD present. Muscular tenderness present. No spinous process tenderness present. No rigidity. No tracheal deviation, no edema, no erythema and normal range of motion present. No thyromegaly present.  Cardiovascular: Normal rate, regular rhythm, normal heart sounds and intact distal pulses.  PMI is not displaced.  Exam reveals no gallop and no friction rub.   No murmur heard. Pulses:      Radial pulses are 2+ on the right side, and 2+ on the left side.       Dorsalis pedis pulses are 2+ on the right side, and 2+ on the left side.  Pulmonary/Chest: Effort normal and breath sounds normal. No respiratory distress. She has no wheezes. She has no rales. She exhibits no tenderness.  Abdominal: Soft. Bowel sounds are normal. She exhibits no distension and no mass. There is no tenderness. There is no rebound and no guarding.  Musculoskeletal: Normal range of motion. She exhibits no edema or tenderness.  Lymphadenopathy:    She has no cervical adenopathy.  Neurological: She is alert and oriented to person, place, and time. She has normal reflexes. No cranial nerve deficit. She exhibits normal muscle tone. Coordination normal.  Skin: Skin is warm and dry. No rash noted. She is not diaphoretic. No erythema. No pallor.  Psychiatric: She has a normal mood and affect. Her behavior is normal. Judgment and thought content normal.  Nursing note and vitals reviewed.   ED Course  Procedures (including critical care time) Labs Review Labs Reviewed  COMPREHENSIVE METABOLIC PANEL - Abnormal; Notable for the following:     ALT 13 (*)    Anion gap 4 (*)    All other components within normal limits  TROPONIN I  CBC  TROPONIN I    Imaging Review Dg Chest 2 View  10/20/2014  CLINICAL DATA:  Chest pressure and tightness since yesterday appear EXAM: CHEST  2 VIEW COMPARISON:  None. FINDINGS: Normal mediastinum and cardiac silhouette. Normal pulmonary vasculature. No evidence of effusion, infiltrate, or pneumothorax. No acute bony abnormality. IMPRESSION: No acute cardiopulmonary process. Electronically Signed   By: Genevive BiStewart  Edmunds M.D.   On: 10/20/2014 14:17   I have personally reviewed and evaluated these images and lab results as part of my medical decision-making.   EKG Interpretation   Date/Time:  Monday October 20 2014 13:17:10 EDT Ventricular Rate:  76 PR Interval:  122 QRS Duration: 84 QT Interval:  398 QTC Calculation: 447 R Axis:   72 Text Interpretation:  Normal sinus rhythm Normal ECG No previous tracing  Confirmed by Anitra Lauth  MD, Alphonzo Lemmings ([id number]) on 10/20/2014 2:06:31 PM      MDM   Final diagnoses:  Chest pain, unspecified chest pain type    CP - work up for ACS/CP CP is intermittent, more closely associated with eating than exertion.  GI cocktail did help relieve pain. Chest pain workup is unremarkable, normal chloride, normal blood counts, initial troponin negative. Chest x-ray negative for acute cardiopulmonary processes, EKG normal sinus rhythm without ST elevation or depression. Patient is low risk for cardiac event, with heart score of 1, PERC negative Pt's repeat troponin is negative.  Will treat for gastritis/GERD with prescription for omeprazole, and follow up with PCP.  Filed Vitals:   10/20/14 1308 10/20/14 1400 10/20/14 1715  BP: 148/82 120/62 115/67  Pulse: 69 64 56  Temp: 98.4 F (36.9 C)    TempSrc: Oral    Resp: Height:  (1.753 m)    Weight: 211 lb (95.709 kg)    SpO2: 100% 100% 100%       Danelle Berry, PA-C 10/20/14 1724  Gwyneth Sprout, MD 10/21/14 [phone]

## 2014-10-20 NOTE — ED Notes (Signed)
Pt ambulated without difficulty. HR 78, RR 16, SpO2 99%. Denies increased SOB. No complications noted.

## 2014-10-20 NOTE — Discharge Instructions (Signed)
Nonspecific Chest Pain  Chest pain can be caused by many different conditions. There is always a chance that your pain could be related to something serious, such as a heart attack or a blood clot in your lungs. Chest pain can also be caused by conditions that are not life-threatening. If you have chest pain, it is very important to follow up with your health care provider. CAUSES  Chest pain can be caused by:  Heartburn.  Pneumonia or bronchitis.  Anxiety or stress.  Inflammation around your heart (pericarditis) or lung (pleuritis or pleurisy).  A blood clot in your lung.  A collapsed lung (pneumothorax). It can develop suddenly on its own (spontaneous pneumothorax) or from trauma to the chest.  Shingles infection (varicella-zoster virus).  Heart attack.  Damage to the bones, muscles, and cartilage that make up your chest wall. This can include:  Bruised bones due to injury.  Strained muscles or cartilage due to frequent or repeated coughing or overwork.  Fracture to one or more ribs.  Sore cartilage due to inflammation (costochondritis). RISK FACTORS  Risk factors for chest pain may include:  Activities that increase your risk for trauma or injury to your chest.  Respiratory infections or conditions that cause frequent coughing.  Medical conditions or overeating that can cause heartburn.  Heart disease or family history of heart disease.  Conditions or health behaviors that increase your risk of developing a blood clot.  Having had chicken pox (varicella zoster). SIGNS AND SYMPTOMS Chest pain can feel like:  Burning or tingling on the surface of your chest or deep in your chest.  Crushing, pressure, aching, or squeezing pain.  Dull or sharp pain that is worse when you move, cough, or take a deep breath.  Pain that is also felt in your back, neck, shoulder, or arm, or pain that spreads to any of these areas. Your chest pain may come and go, or it may stay  constant. DIAGNOSIS Lab tests or other studies may be needed to find the cause of your pain. Your health care provider may have you take a test called an ambulatory ECG (electrocardiogram). An ECG records your heartbeat patterns at the time the test is performed. You may also have other tests, such as:  Transthoracic echocardiogram (TTE). During echocardiography, sound waves are used to create a picture of all of the heart structures and to look at how blood flows through your heart.  Transesophageal echocardiogram (TEE).This is a more advanced imaging test that obtains images from inside your body. It allows your health care provider to see your heart in finer detail.  Cardiac monitoring. This allows your health care provider to monitor your heart rate and rhythm in real time.  Holter monitor. This is a portable device that records your heartbeat and can help to diagnose abnormal heartbeats. It allows your health care provider to track your heart activity for several days, if needed.  Stress tests. These can be done through exercise or by taking medicine that makes your heart beat more quickly.  Blood tests.  Imaging tests. TREATMENT  Your treatment depends on what is causing your chest pain. Treatment may include:  Medicines. These may include:  Acid blockers for heartburn.  Anti-inflammatory medicine.  Pain medicine for inflammatory conditions.  Antibiotic medicine, if an infection is present.  Medicines to dissolve blood clots.  Medicines to treat coronary artery disease.  Supportive care for conditions that do not require medicines. This may include:  Resting.  Applying heat  or cold packs to injured areas.  Limiting activities until pain decreases. HOME CARE INSTRUCTIONS  If you were prescribed an antibiotic medicine, finish it all even if you start to feel better.  Avoid any activities that bring on chest pain.  Do not use any tobacco products, including  cigarettes, chewing tobacco, or electronic cigarettes. If you need help quitting, ask your health care provider.  Do not drink alcohol.  Take medicines only as directed by your health care provider.  Keep all follow-up visits as directed by your health care provider. This is important. This includes any further testing if your chest pain does not go away.  If heartburn is the cause for your chest pain, you may be told to keep your head raised (elevated) while sleeping. This reduces the chance that acid will go from your stomach into your esophagus.  Make lifestyle changes as directed by your health care provider. These may include:  Getting regular exercise. Ask your health care provider to suggest some activities that are safe for you.  Eating a heart-healthy diet. A registered dietitian can help you to learn healthy eating options.  Maintaining a healthy weight.  Managing diabetes, if necessary.  Reducing stress. SEEK MEDICAL CARE IF:  Your chest pain does not go away after treatment.  You have a rash with blisters on your chest.  You have a fever. SEEK IMMEDIATE MEDICAL CARE IF:   Your chest pain is worse.  You have an increasing cough, or you cough up blood.  You have severe abdominal pain.  You have severe weakness.  You faint.  You have chills.  You have sudden, unexplained chest discomfort.  You have sudden, unexplained discomfort in your arms, back, neck, or jaw.  You have shortness of breath at any time.  You suddenly start to sweat, or your skin gets clammy.  You feel nauseous or you vomit.  You suddenly feel light-headed or dizzy.  Your heart begins to beat quickly, or it feels like it is skipping beats. These symptoms may represent a serious problem that is an emergency. Do not wait to see if the symptoms will go away. Get medical help right away. Call your local emergency services (911 in the [place] Do not drive yourself to the hospital.   This  information is not intended to replace advice given to you by your health care provider. Make sure you discuss any questions you have with your health care provider.   Document Released: 09/29/2004 Document Revised: 01/10/2014 Document Reviewed: 07/26/2013 Elsevier Interactive Patient Education 2016 [employer] Inc[employer]  Gastritis, Adult Gastritis is soreness and swelling (inflammation) of the lining of the stomach. Gastritis can develop as a sudden onset (acute) or long-term (chronic) condition. If gastritis is not treated, it can lead to stomach bleeding and ulcers. CAUSES  Gastritis occurs when the stomach lining is weak or damaged. Digestive juices from the stomach then inflame the weakened stomach lining. The stomach lining may be weak or damaged due to viral or bacterial infections. One common bacterial infection is the Helicobacter pylori infection. Gastritis can also result from excessive alcohol consumption, taking certain medicines, or having too much acid in the stomach.  SYMPTOMS  In some cases, there are no symptoms. When symptoms are present, they may include:  Pain or a burning sensation in the upper abdomen.  Nausea.  Vomiting.  An uncomfortable feeling of fullness after eating. DIAGNOSIS  Your caregiver may suspect you have gastritis based on your symptoms and a physical  exam. To determine the cause of your gastritis, your caregiver may perform the following:  Blood or stool tests to check for the H pylori bacterium.  Gastroscopy. A thin, flexible tube (endoscope) is passed down the esophagus and into the stomach. The endoscope has a light and camera on the end. Your caregiver uses the endoscope to view the inside of the stomach.  Taking a tissue sample (biopsy) from the stomach to examine under a microscope. TREATMENT  Depending on the cause of your gastritis, medicines may be prescribed. If you have a bacterial infection, such as an H pylori infection, antibiotics may be  given. If your gastritis is caused by too much acid in the stomach, H2 blockers or antacids may be given. Your caregiver may recommend that you stop taking aspirin, ibuprofen, or other nonsteroidal anti-inflammatory drugs (NSAIDs). HOME CARE INSTRUCTIONS  Only take over-the-counter or prescription medicines as directed by your caregiver.  If you were given antibiotic medicines, take them as directed. Finish them even if you start to feel better.  Drink enough fluids to keep your urine clear or pale yellow.  Avoid foods and drinks that make your symptoms worse, such as:  Caffeine or alcoholic drinks.  Chocolate.  Peppermint or mint flavorings.  Garlic and onions.  Spicy foods.  Citrus fruits, such as oranges, lemons, or limes.  Tomato-based foods such as sauce, chili, salsa, and pizza.  Fried and fatty foods.  Eat small, frequent meals instead of large meals. SEEK IMMEDIATE MEDICAL CARE IF:   You have black or dark red stools.  You vomit blood or material that looks like coffee grounds.  You are unable to keep fluids down.  Your abdominal pain gets worse.  You have a fever.  You do not feel better after 1 week.  You have any other questions or concerns. MAKE SURE YOU:  Understand these instructions.  Will watch your condition.  Will get help right away if you are not doing well or get worse.   This information is not intended to replace advice given to you by your health care provider. Make sure you discuss any questions you have with your health care provider.   Document Released: 12/14/2000 Document Revised: 06/21/2011 Document Reviewed: 02/02/2011 Elsevier Interactive Patient Education [employer]

## 2014-10-20 NOTE — ED Notes (Signed)
CP started last night.  

## 2014-10-20 NOTE — ED Notes (Signed)
Patient transported to X-ray 

## 2014-10-22 ENCOUNTER — Encounter: Payer: Self-pay | Admitting: Family Medicine

## 2014-10-22 ENCOUNTER — Ambulatory Visit (INDEPENDENT_AMBULATORY_CARE_PROVIDER_SITE_OTHER): Payer: BLUE CROSS/BLUE SHIELD | Admitting: Family Medicine

## 2014-10-22 VITALS — BP 102/70 | HR 72 | Ht 67.0 in | Wt 214.0 lb

## 2014-10-22 DIAGNOSIS — M940 Chondrocostal junction syndrome [Tietze]: Secondary | ICD-10-CM

## 2014-10-22 DIAGNOSIS — R079 Chest pain, unspecified: Secondary | ICD-10-CM | POA: Diagnosis not present

## 2014-10-22 MED ORDER — DEXLANSOPRAZOLE 60 MG PO CPDR: 60.0000 mg | DELAYED_RELEASE_CAPSULE | Freq: Every day | ORAL | Status: DC

## 2014-10-22 NOTE — Progress Notes (Signed)
Chief Complaint  Patient presents with  . Chest Pain    seen at [facility] this past Monday. Still having chest pain, worsens when she is active. Left arm numbness and tingling have subsided. Was lightheaded this morning. No vomiting or diarrhea.   She went to the ER on 10/17 with chest pain, as well as posterior neck pain with radiation into LUE with associated numbness in her left fingers. At the time of her chest pain, she denied associated diaphoresis, vomiting, abdominal pain, shortness of breath, wheeze, lower extremity edema, palpitations, syncope, dyspepsia, GERD, fever, chills, sweats.  She denies any trauma to her chest, change in activity, recent URI, cough, wheeze, recent travel, periods of stasis, history of blood clots.  ER evaluation included normal EKG, CXR, negative troponin x 2, normal CBC and chem panel.   She was prescribed omeprazole but admits she hasn't filled it yet. It was felt there was a component of discomfort related to eating.  Patient gives detailed history today:  She reports that pain [place] in L [place]  That night she felt like her BP was up, had some chest discomfort.  She went to bed early.  She felt fine Monday morning, but around 11-11:30 she noticed the arm feeling tingly, chest started hurting again.  She went out to lunch and then she started feeling nauseated, chest started hurting.  She could hardly walk due to the tightness in her chest, so she went to the ER.    She felt okay when she left the ER.  She has had ongoing twinges of pain in the chest ever since Monday.  "twinges" of pain are below the left breast, medially near the breast bone. She is also having "contractions"--squeezing, in the center of her chest.  This was worse when in the shower, and she then got lightheaded this morning. On and off symptoms throughout the day yesterday. No further arm pain since Monday. No further nausea, or symptoms related to  eating. She did not fill the omeprazole rx from the ER (which was for 20mg  dose).  She took Aleve Thursday or Friday for a headache (just 2 pills), which worked.  Denies any other NSAIDs. Denies heartburn.  PMH, PSH, SH reviewed.  Outpatient Encounter Prescriptions as of 10/22/2014  Medication Sig  . Calcium Carbonate-Vitamin D (CALCIUM 600+D) 600-400 MG-UNIT per tablet Take 1 tablet by mouth daily.  . Multiple Vitamin (MULITIVITAMIN WITH MINERALS) TABS Take 1 tablet by mouth daily.  . Omega-3 Fatty Acids (FISH OIL PO) Take by mouth.  Marland Kitchen. omeprazole (PRILOSEC) 20 MG capsule Take 1 capsule (20 mg total) by mouth daily. (Patient not taking: Reported on 10/22/2014)   No facility-administered encounter medications on file as of 10/22/2014.   Allergies  Allergen Reactions  . Penicillins Rash and Other (See Comments)    fever   ROS: no fever, chills, headaches, palpitations, shortness of breath. No further nausea, no vomiting, diarrhea, bleeding, bruising. Dizzy while in the shower this morning, otherwise no dizziness.  No URI symptoms or other complaints.  PHYSICAL EXAM: BP 102/70 mmHg  Pulse 72  Ht 5\' 7"  (1.702 m)  Wt 214 lb (97.07 kg)  BMI 33.51 kg/m2  LMP 10/13/2014  Well developed, well-appearing female in no distress Neck: no lymphadenopathy or mass Heart: regular rate and rhythm without murmur Lungs: clear bilaterally, with good air movement Chest: Tender at costochondral junctions x 2 on the left, at the level below her breast (  where underwire touches and just above this).  nontender elsewhere along the sternum or other costochondral junctions. Abdomen: soft, nontender, no organomegaly or mass Extremities: no edema, normal pulses Psych: normal mood, affect, hygiene and grooming Neuro: alert and oriented. Cranial nerves intact. Normal gait, strength     Chemistry      Component Value Date/Time   NA 140 10/20/2014 1355   K 4.2 10/20/2014 1355   CL 107 10/20/2014 1355    CO2 29 10/20/2014 1355   BUN 13 10/20/2014 1355   CREATININE 0.81 10/20/2014 1355   CREATININE 0.77 07/21/2011 0952      Component Value Date/Time   CALCIUM 8.9 10/20/2014 1355   ALKPHOS 56 10/20/2014 1355   AST 22 10/20/2014 1355   ALT 13* 10/20/2014 1355   BILITOT 0.5 10/20/2014 1355     Lab Results  Component Value Date   WBC 7.2 10/20/2014   HGB 13.0 10/20/2014   HCT 39.0 10/20/2014   MCV 90.9 10/20/2014   PLT 253 10/20/2014   Lab Results  Component Value Date   TROPONINI <0.03 10/20/2014    ASSESSMENT/PLAN:  Costochondritis - moist heat, NSAID's (along with PPI)  Chest pain, unspecified chest pain type - normal ER w/u; consider further eval vs GI eval if ongoing. Treat for GI with Dexilant samples/omeprazole   Take Aleve twice daily for 7-10 days.  If you are still having significant discomfort on the left lower chest, you can increase up to 2 tablets twice daily of Aleve. Because I don't want the Aleve to worsen the other chest pain you are having (ie if the etiology is related to the esophagus), I want you to take a medication to protect the stomach.  I'm giving you samples of Dexilant to take just once daily.  Take this instead of the omeprazole that was prescribed from the ER (it is much stronger).  If after 5 days you have noted improvement in the mid-chest pain, you should probably continue to take a similar medication for another two weeks or until the pain has resolved.  You can try an over-the-counter medication such as Prilosec OTC.  This is what you were prescribed--likely is less expensive to fill the prescription you were given from the ER.  If this medication (omeprazole or prilosec) doesn't seem to work as well as the [employer], you can double up and take  daily (either both pills together, or one twice daily).   Let us know if you are not improving over the next week. Return to the ER if you develop shortness of breath, worsening chest pain,  dizziness/fainting spells, extremely fast heart rate or irregular rhythm.  F/u 1 week if not improving.

## 2014-10-22 NOTE — Patient Instructions (Signed)
Take Aleve twice daily for 7-10 days.  If you are still having significant discomfort on the left lower chest, you can increase up to 2 tablets twice daily of Aleve. Because I don't want the Aleve to worsen the other chest pain you are having (ie if the etiology is related to the esophagus), I want you to take a medication to protect the stomach.  I'm giving you samples of Dexilant to take just once daily.  Take this instead of the omeprazole that was prescribed from the ER (it is much stronger).  If after 5 days you have noted improvement in the mid-chest pain, you should probably continue to take a similar medication for another two weeks or until the pain has resolved.  You can try an over-the-counter medication such as Prilosec OTC.  This is what you were prescribed--likely is less expensive to fill the prescription you were given from the ER.  If this medication (omeprazole or prilosec) doesn't seem to work as well as the [employer]exilant sample, you can double up and take 40mg  daily (either both pills together, or one twice daily).   Let us know if you are not improving over the next week. Return to the ER if you develop shortness of breath, worsening chest pain, dizziness/fainting spells, extremely fast heart rate or irregular rhythm.  Keep a record of your symptoms and return next week if not improving (or if worse). Costochondritis Costochondritis, sometimes called Tietze syndrome, is a swelling and irritation (inflammation) of the tissue (cartilage) that connects your ribs with your breastbone (sternum). It causes pain in the chest and rib area. Costochondritis usually goes away on its own over time. It can take up to 6 weeks or longer to get better, especially if you are unable to limit your activities. CAUSES  Some cases of costochondritis have no known cause. Possible causes include:  Injury (trauma).  Exercise or activity such as lifting.  Severe coughing. SIGNS AND SYMPTOMS  Pain and  tenderness in the chest and rib area.  Pain that gets worse when coughing or taking deep breaths.  Pain that gets worse with specific movements. DIAGNOSIS  Your health care provider will do a physical exam and ask about your symptoms. Chest X-rays or other tests may be done to rule out other problems. TREATMENT  Costochondritis usually goes away on its own over time. Your health care provider may prescribe medicine to help relieve pain. HOME CARE INSTRUCTIONS   Avoid exhausting physical activity. Try not to strain your ribs during normal activity. This would include any activities using chest, abdominal, and side muscles, especially if heavy weights are used.  Apply ice to the affected area for the first 2 days after the pain begins.  Put ice in a plastic bag.  Place a towel between your skin and the bag.  Leave the ice on for 20 minutes, 2-3 times a day.  Only take over-the-counter or prescription medicines as directed by your health care provider. SEEK MEDICAL CARE IF:  You have redness or swelling at the rib joints. These are signs of infection.  Your pain does not go away despite rest or medicine. SEEK IMMEDIATE MEDICAL CARE IF:   Your pain increases or you are very uncomfortable.  You have shortness of breath or difficulty breathing.  You cough up blood.  You have worse chest pains, sweating, or vomiting.  You have a fever or persistent symptoms for more than 2-3 days.  You have a fever and your  symptoms suddenly get worse. MAKE SURE YOU:   Understand these instructions.  Will watch your condition.  Will get help right away if you are not doing well or get worse.   This information is not intended to replace advice given to you by your health care provider. Make sure you discuss any questions you have with your health care provider.   Document Released: 09/29/2004 Document Revised: 10/10/2012 Document Reviewed: 07/24/2012 Elsevier Interactive Patient Education  [employer]

## 2015-01-15 ENCOUNTER — Encounter: Payer: BLUE CROSS/BLUE SHIELD | Admitting: Family Medicine

## 2015-06-20 ENCOUNTER — Encounter (HOSPITAL_BASED_OUTPATIENT_CLINIC_OR_DEPARTMENT_OTHER): Payer: Self-pay

## 2015-06-20 ENCOUNTER — Emergency Department (HOSPITAL_BASED_OUTPATIENT_CLINIC_OR_DEPARTMENT_OTHER)
Admission: EM | Admit: 2015-06-20 | Discharge: 2015-06-20 | Disposition: A | Discharge status: Home / Self Care | Payer: BLUE CROSS/BLUE SHIELD | Attending: Emergency Medicine | Admitting: Emergency Medicine

## 2015-06-20 DIAGNOSIS — Z87891 Personal history of nicotine dependence: Secondary | ICD-10-CM | POA: Insufficient documentation

## 2015-06-20 DIAGNOSIS — R110 Nausea: Secondary | ICD-10-CM | POA: Insufficient documentation

## 2015-06-20 DIAGNOSIS — N390 Urinary tract infection, site not specified: Secondary | ICD-10-CM

## 2015-06-20 DIAGNOSIS — R109 Unspecified abdominal pain: Secondary | ICD-10-CM | POA: Diagnosis present

## 2015-06-20 LAB — URINE MICROSCOPIC-ADD ON

## 2015-06-20 LAB — URINALYSIS, ROUTINE W REFLEX MICROSCOPIC
Glucose, UA: NEGATIVE mg/dL
Ketones, ur: NEGATIVE mg/dL
NITRITE: POSITIVE — AB
PH: 5.5 (ref 5.0–8.0)
Protein, ur: 30 mg/dL — AB
SPECIFIC GRAVITY, URINE: 1.025 (ref 1.005–1.030)

## 2015-06-20 LAB — PREGNANCY, URINE: PREG TEST UR: NEGATIVE

## 2015-06-20 MED ORDER — PHENAZOPYRIDINE HCL 200 MG PO TABS: 200.0000 mg | ORAL_TABLET | Freq: Three times a day (TID) | ORAL | Status: DC

## 2015-06-20 MED ORDER — SULFAMETHOXAZOLE-TRIMETHOPRIM 800-160 MG PO TABS: 1.0000 | ORAL_TABLET | Freq: Two times a day (BID) | ORAL | Status: AC

## 2015-06-20 NOTE — ED Notes (Signed)
Pt reports nausea, vomiting, intermittent fevers and abdominal pain at home. No relief with ibuprofen or AZO.

## 2015-06-20 NOTE — Discharge Instructions (Signed)
Stay very well hydrated with plenty of water throughout the day. Please take antibiotic until completion. Use pyridium as directed to decrease painful urination but know that a common side effect is to turn your urine a bright orange/red color. This is not a harmful side effect. Follow up with primary care physician in 1 week for recheck of ongoing symptoms. If symptoms not improved on Monday morning, call primary physician to schedule an earlier follow up.   Please seek immediate care if you develop the following: Your symptoms are no better or worse in 3 days. There is severe back pain or lower abdominal pain.  You develop chills.  You have a fever.  There is nausea or vomiting.  There is continued burning or discomfort with urination.

## 2015-06-20 NOTE — ED Provider Notes (Signed)
CSN: [id number]     Arrival date & time 06/20/15  1555 History   First MD Initiated Contact with Patient 06/20/15 1803     Chief Complaint  Patient presents with  . Abdominal Pain    (Consider location/radiation/quality/duration/timing/severity/associated sxs/prior Treatment) Patient is a 50 y.o. female presenting with abdominal pain. The history is provided by the patient and medical records. No language interpreter was used.  Abdominal Pain Associated symptoms: dysuria, fever (resolved) and nausea   Associated symptoms: no cough, no shortness of breath, no vaginal discharge and no vomiting      Soyla DryerCindy Pastrana is a 50 y.o. female  with no pertinent PMH who presents to the Emergency Department complaining of dysuria and nausea that began 3 days ago after being out on the beach all day. Patient states she had one episode of emesis at symptom onset, but no further emesis. Mild low back pain also endorsed. Subjective fever initially but none in the last 2 days. She took Azo and ibuprofen with minimal relief. No other medications taken prior to arrival. She has tried to plenty of water and cramping. She is but dysuria and nausea has persisted. Denies urinary frequency, abdominal pain.    Past Medical History  Diagnosis Date  . Compression fracture of lumbar vertebra (HCC) 03/2011    L2  . Seasonal allergies     summer   Past Surgical History  Procedure Laterality Date  . Cesarean section      x4  . Essure tubal ligation  06/2007    Dr. Jackelyn KnifeMeisinger  . Wisdom tooth extraction     Family History  Problem Relation Age of Onset  . Stroke Mother   . Hypertension Mother   . Kidney disease Mother     dialysis  . Heart disease Father [id number]    MI  . Hypertension Father   . Diabetes Paternal Aunt   . Cancer Neg Hx    Social History  Substance Use Topics  . Smoking status: Former Smoker -- 0.20 packs/day for 10 years    Types: Cigarettes    Quit date: 01/07/2010  . Smokeless tobacco: Never  Used  . Alcohol Use: 0.0 oz/week    0 Standard drinks or equivalent per week     Comment: 0-1 drinks per week.   OB History    Gravida Para Term Preterm AB TAB SAB Ectopic Multiple Living   4 4        5       Obstetric Comments   Twins with first pregnancy     Review of Systems  Constitutional: Positive for fever (resolved). Negative for appetite change.  HENT: Negative for congestion.   Eyes: Negative for visual disturbance.  Respiratory: Negative for cough and shortness of breath.   Cardiovascular: Negative.   Gastrointestinal: Positive for nausea and abdominal pain. Negative for vomiting.  Genitourinary: Positive for dysuria. Negative for frequency and vaginal discharge.  Musculoskeletal: Positive for back pain. Negative for neck pain.  Skin: Negative for rash.  Neurological: Negative for headaches.      Allergies  Penicillins  Home Medications   Prior to Admission medications   Medication Sig Start Date End Date Taking? Authorizing Provider  Calcium Carbonate-Vitamin D (CALCIUM 600+D) 600-400 MG-UNIT per tablet Take 1 tablet by mouth daily.    Historical Provider, MD  dexlansoprazole (DEXILANT) 60 MG capsule Take 1 capsule (60 mg total) by mouth daily. 10/22/14   Joselyn ArrowEve Knapp, MD  Multiple Vitamin (MULITIVITAMIN WITH MINERALS) TABS Take  1 tablet by mouth daily.    Historical Provider, MD  Omega-3 Fatty Acids (FISH OIL PO) Take by mouth.    Historical Provider, MD  omeprazole (PRILOSEC) 20 MG capsule Take 1 capsule (20 mg total) by mouth daily. Patient not taking: Reported on 10/22/2014 10/20/14   Danelle Berry, PA-C  phenazopyridine (PYRIDIUM) 200 MG tablet Take 1 tablet (200 mg total) by mouth 3 (three) times daily. 06/20/15   Chase Picket Ward, PA-C  sulfamethoxazole-trimethoprim (BACTRIM DS,SEPTRA DS) 800-160 MG tablet Take 1 tablet by mouth 2 (two) times daily. 06/20/15 06/27/15  Jaime Pilcher Ward, PA-C   BP 118/63 mmHg  Pulse 70  Temp(Src) 98.6 F (37 C) (Oral)  Resp  18  Ht  (1.753 m)  Wt 99.791 kg  BMI 32.47 kg/m2  SpO2 100% Physical Exam  Constitutional: She is oriented to person, place, and time. She appears well-developed and well-nourished.  Alert and in no acute distress  HENT:  Head: Normocephalic and atraumatic.  Cardiovascular: Normal rate, regular rhythm, normal heart sounds and intact distal pulses.  Exam reveals no gallop and no friction rub.   No murmur heard. Pulmonary/Chest: Effort normal and breath sounds normal. No respiratory distress.  Abdominal: Soft. Bowel sounds are normal. She exhibits no distension and no mass. There is no rebound and no guarding.  No abdominal tenderness. No CVA tenderness.   Musculoskeletal: She exhibits no edema.  Neurological: She is alert and oriented to person, place, and time.  Skin: Skin is warm and dry.  Nursing note and vitals reviewed.   ED Course  Procedures (including critical care time) Labs Review Labs Reviewed  URINALYSIS, ROUTINE W REFLEX MICROSCOPIC (NOT AT [facility]) - Abnormal; Notable for the following:    Color, Urine ORANGE (*)    APPearance CLOUDY (*)    Hgb urine dipstick LARGE (*)    Bilirubin Urine SMALL (*)    Protein, ur 30 (*)    Nitrite POSITIVE (*)    Leukocytes, UA MODERATE (*)    All other components within normal limits  URINE MICROSCOPIC-ADD ON - Abnormal; Notable for the following:    Squamous Epithelial / LPF 0-5 (*)    Bacteria, UA MANY (*)    All other components within normal limits  URINE CULTURE  PREGNANCY, URINE    Imaging Review No results found. I have personally reviewed and evaluated these images and lab results as part of my medical decision-making.   EKG Interpretation None      MDM   Final diagnoses:  UTI (lower urinary tract infection)   Tashawna Thom presents to ED for dysuria and nausea. Mild intermittent low back pain also endorsed. On exam, patient is afebrile, well-appearing, hemodynamically stable. There is no abdominal  tenderness-nonsurgical abdomen. Urine was obtained which shows nitrite positive, moderate leuks, 6-30 white cells. Urine culture in 2016 susceptible to Bactrim. Patient with penicillin allergy and unsure if she has had cephalosporins in the past. Will treat with Bactrim. Rx for Pyridium also given. PCP follow-up in 1 week, if symptoms are not improved by Monday call for earlier follow-up. Return precautions were given. All questions answered.   [facility] Ward, PA-C 06/20/15 1847  Linwood Dibbles, MD 06/20/15 [id number]

## 2015-06-23 LAB — URINE CULTURE: Culture: >=100000 — AB

## 2015-06-24 ENCOUNTER — Telehealth (HOSPITAL_BASED_OUTPATIENT_CLINIC_OR_DEPARTMENT_OTHER): Payer: Self-pay | Admitting: Emergency Medicine

## 2015-06-24 NOTE — Telephone Encounter (Signed)
Post ED Visit - Positive Culture Follow-up  Culture report reviewed by antimicrobial stewardship pharmacist:  []  Enzo BiNathan Batchelder, Pharm.D. []  Celedonio MiyamotoJeremy Frens, Pharm.D., BCPS []  Garvin FilaMike Maccia, Pharm.D. []  Georgina PillionElizabeth Martin, Pharm.D., BCPS []  Colmar ManorMinh Pham, [address] Rainbow BoulevardPharm.D., BCPS, AAHIVP []  Estella HuskMichelle Turner, Pharm.D., BCPS, AAHIVP [x]  Tennis Mustassie Stewart, Pharm.D. []  Sherle Poeob Vincent, [place]D.  Positive urine culture Treated with Bactrim DS, organism sensitive to the same and no further patient follow-up is required at this time.  Berle MullMiller, Prateek Knipple 06/24/2015, 10:23 AM

## 2015-10-09 ENCOUNTER — Other Ambulatory Visit: Payer: Self-pay | Admitting: Family Medicine

## 2015-10-09 DIAGNOSIS — Z1231 Encounter for screening mammogram for malignant neoplasm of breast: Secondary | ICD-10-CM

## 2015-10-28 ENCOUNTER — Ambulatory Visit: Payer: BLUE CROSS/BLUE SHIELD

## 2015-11-06 DIAGNOSIS — Z0279 Encounter for issue of other medical certificate: Secondary | ICD-10-CM

## 2015-11-17 ENCOUNTER — Ambulatory Visit
Admission: RE | Admit: 2015-11-17 | Discharge: 2015-11-17 | Disposition: A | Discharge status: Home / Self Care | Payer: BLUE CROSS/BLUE SHIELD | Source: Ambulatory Visit | Attending: Family Medicine | Admitting: Family Medicine

## 2015-11-17 DIAGNOSIS — Z1231 Encounter for screening mammogram for malignant neoplasm of breast: Secondary | ICD-10-CM

## 2015-12-13 ENCOUNTER — Emergency Department (HOSPITAL_BASED_OUTPATIENT_CLINIC_OR_DEPARTMENT_OTHER): Payer: BLUE CROSS/BLUE SHIELD

## 2015-12-13 ENCOUNTER — Emergency Department (HOSPITAL_BASED_OUTPATIENT_CLINIC_OR_DEPARTMENT_OTHER)
Admission: EM | Admit: 2015-12-13 | Discharge: 2015-12-13 | Disposition: A | Discharge status: Home / Self Care | Payer: BLUE CROSS/BLUE SHIELD | Attending: Emergency Medicine | Admitting: Emergency Medicine

## 2015-12-13 ENCOUNTER — Encounter (HOSPITAL_BASED_OUTPATIENT_CLINIC_OR_DEPARTMENT_OTHER): Payer: Self-pay | Admitting: Emergency Medicine

## 2015-12-13 DIAGNOSIS — S8991XA Unspecified injury of right lower leg, initial encounter: Secondary | ICD-10-CM | POA: Diagnosis present

## 2015-12-13 DIAGNOSIS — Y939 Activity, unspecified: Secondary | ICD-10-CM | POA: Diagnosis not present

## 2015-12-13 DIAGNOSIS — Z79899 Other long term (current) drug therapy: Secondary | ICD-10-CM | POA: Insufficient documentation

## 2015-12-13 DIAGNOSIS — Y929 Unspecified place or not applicable: Secondary | ICD-10-CM | POA: Insufficient documentation

## 2015-12-13 DIAGNOSIS — Y999 Unspecified external cause status: Secondary | ICD-10-CM | POA: Insufficient documentation

## 2015-12-13 DIAGNOSIS — W010XXA Fall on same level from slipping, tripping and stumbling without subsequent striking against object, initial encounter: Secondary | ICD-10-CM | POA: Diagnosis not present

## 2015-12-13 DIAGNOSIS — Z87891 Personal history of nicotine dependence: Secondary | ICD-10-CM | POA: Insufficient documentation

## 2015-12-13 DIAGNOSIS — S93401A Sprain of unspecified ligament of right ankle, initial encounter: Secondary | ICD-10-CM | POA: Diagnosis not present

## 2015-12-13 NOTE — ED Triage Notes (Signed)
Pt slipped on ice this morning injuring R ankle, denies LOC.

## 2015-12-13 NOTE — ED Provider Notes (Signed)
[facility]   CSN: [id number] Arrival date & time: 12/13/15  1111     History   Chief Complaint Chief Complaint  Patient presents with  . Fall    HPI Nancy DryerCindy Stevenson is a 50 y.o. female.  Patient is a 50 year old female who presents for evaluation of right ankle pain. She slipped on the ice this morning and inverted it. She is complaining of pain to the lateral aspect of the ankle. It is worse with ambulation and palpation.   The history is provided by the patient.  Fall  This is a new problem. The current episode started 1 to 2 hours ago. The problem occurs constantly. The problem has not changed since onset.The symptoms are aggravated by walking. Nothing relieves the symptoms. She has tried nothing for the symptoms.    Past Medical History:  Diagnosis Date  . Compression fracture of lumbar vertebra (HCC) 03/2011   L2  . Seasonal allergies    summer    There are no active problems to display for this patient.   Past Surgical History:  Procedure Laterality Date  . CESAREAN SECTION     x4  . ESSURE TUBAL LIGATION  06/2007   Dr. Jackelyn KnifeMeisinger  . WISDOM TOOTH EXTRACTION      OB History    Gravida Para Term Preterm AB Living   4 4       5    SAB TAB Ectopic Multiple Live Births                  Obstetric Comments   Twins with first pregnancy       Home Medications    Prior to Admission medications   Medication Sig Start Date End Date Taking? Authorizing Provider  Calcium Carbonate-Vitamin D (CALCIUM 600+D) 600-400 MG-UNIT per tablet Take 1 tablet by mouth daily.    Historical Provider, MD  dexlansoprazole (DEXILANT) 60 MG capsule Take 1 capsule (60 mg total) by mouth daily. 10/22/14   Joselyn ArrowEve Knapp, MD  Multiple Vitamin (MULITIVITAMIN WITH MINERALS) TABS Take 1 tablet by mouth daily.    Historical Provider, MD  Omega-3 Fatty Acids (FISH OIL PO) Take by mouth.    Historical Provider, MD  omeprazole (PRILOSEC) 20 MG capsule Take 1 capsule (20  mg total) by mouth daily. 10/20/14   Danelle BerryLeisa Tapia, PA-C  phenazopyridine (PYRIDIUM) 200 MG tablet Take 1 tablet (200 mg total) by mouth 3 (three) times daily. 06/20/15   Chase PicketJaime Pilcher Ward, PA-C    Family History Family History  Problem Relation Age of Onset  . Stroke Mother   . Hypertension Mother   . Kidney disease Mother     dialysis  . Heart disease Father 3180    MI  . Hypertension Father   . Diabetes Paternal Aunt   . Cancer Neg Hx     Social History Social History  Substance Use Topics  . Smoking status: Former Smoker    Packs/day: 0.20    Years: 10.00    Types: Cigarettes    Quit date: 01/07/2010  . Smokeless tobacco: Never Used  . Alcohol use 0.0 oz/week     Comment: 0-1 drinks per week.     Allergies   Penicillins   Review of Systems Review of Systems  All other systems reviewed and are negative.    Physical Exam Updated Vital Signs BP 129/71 (BP Location: Right Arm)   Pulse 85   Temp 98.2 F (36.8 C) (Oral)   Resp  16   Ht 5\' 9"  (1.753 m)   Wt 217 lb (98.4 kg)   LMP 11/23/2015   SpO2 98%   BMI 32.05 kg/m   Physical Exam  Constitutional: She is oriented to person, place, and time. She appears well-developed and well-nourished. No distress.  HENT:  Head: Normocephalic and atraumatic.  Neck: Normal range of motion. Neck supple.  Musculoskeletal:  There is mild tenderness over the lateral malleolus, however no obvious deformity.  Neurological: She is alert and oriented to person, place, and time.  Skin: Skin is warm and dry. She is not diaphoretic.  Nursing note and vitals reviewed.    ED Treatments / Results  Labs (all labs ordered are listed, but only abnormal results are displayed) Labs Reviewed - No data to display  EKG  EKG Interpretation None       Radiology No results found.  Procedures Procedures (including critical care time)  Medications Ordered in ED Medications - No data to display   Initial Impression / Assessment  and Plan / ED Course  I have reviewed the triage vital signs and the nursing notes.  Pertinent labs & imaging results that were available during my care of the patient were reviewed by me and considered in my medical decision making (see chart for details).  Clinical Course     X-rays are negative for fracture. This will be treated as a sprain with compression, elevation, ice, anti-inflammatories, and when necessary follow-up if not improving in the next 1-2 weeks.  Final Clinical Impressions(s) / ED Diagnoses   Final diagnoses:  None    New Prescriptions New Prescriptions   No medications on file     Geoffery Lyonsouglas Glynn Yepes, MD 12/13/15 1218

## 2015-12-13 NOTE — ED Notes (Signed)
Pt transported to xray now

## 2015-12-13 NOTE — ED Notes (Signed)
Dr Delo in room with pt now. 

## 2015-12-13 NOTE — Discharge Instructions (Signed)
Wear ace bandage for support and comfort.  Ice for 20 minutes every two hours while awake for the next two days.  Ibuprofen 600mg  every six hours as needed for pain.  Follow-up with your primary Dr. if not improving in the next 1-2 weeks.

## 2016-04-26 ENCOUNTER — Telehealth: Payer: Self-pay | Admitting: Family Medicine

## 2016-04-26 NOTE — Telephone Encounter (Signed)
Hasn't been seen by me since 2016.  I would rather her schedule OV to discuss the vaccine--I suspect there are likely forms that will need to be filled out, etc, that I will not do without seeing her.  She should bring any forms to her visit, and she should check with her insurance prior to ensure vaccine covered.  But schedule OV rather than nurse visit.

## 2016-04-26 NOTE — Telephone Encounter (Signed)
Pt wants to schedule to have a MMR booster for school  ? Ok to schedule  Also advised pt to check with her insurance to make sure adult vaccines covered

## 2016-04-27 NOTE — Telephone Encounter (Signed)
Left message to call and schedule ov with Dr. Tomi Bamberger re MMR, per Dr. Johnsie Kindred instructions And if she has any forms for school she will need to bring those to appt also

## 2016-05-05 ENCOUNTER — Encounter: Payer: Self-pay | Admitting: Family Medicine

## 2016-05-05 ENCOUNTER — Ambulatory Visit (INDEPENDENT_AMBULATORY_CARE_PROVIDER_SITE_OTHER): Payer: BLUE CROSS/BLUE SHIELD | Admitting: Family Medicine

## 2016-05-05 VITALS — BP 124/76 | HR 80 | Ht 67.0 in | Wt 231.6 lb

## 2016-05-05 DIAGNOSIS — E669 Obesity, unspecified: Secondary | ICD-10-CM

## 2016-05-05 DIAGNOSIS — Z23 Encounter for immunization: Secondary | ICD-10-CM

## 2016-05-05 NOTE — Progress Notes (Signed)
Chief Complaint  Patient presents with  . 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    Patient presents requesting MMR.  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 She had gone to another clinic (evaluated by Dr. Daiva Eves) and found that she was non-immune to mumps (was immune to rubella and rubeola)--labs drawn 03/22/16. She also had 2-step PPD (3/20 and 4/17), both negative Immune to varicella and Hepatitis B She got tetanus booster 03/22/16--not documented which type of tetanus vaccine she received. Copies of her paperwork was taken and scanned in.  She hasn't been here in about 2 years.  She reports doing well, but wants to lose weight.  She has gained 15-20#. For the last 2 weeks she has been on no-carb diet. BP's fine at school when checked. She is concerned about her blood pressure being elevated today  PMH, PSH, SH and FH were reviewed and updated  Outpatient Encounter Prescriptions as of 05/05/2016  Medication Sig  . Multiple Vitamin (MULITIVITAMIN WITH MINERALS) TABS Take 1 tablet by mouth daily.  . Omega-3 Fatty Acids (FISH OIL PO) Take by mouth.  . [DISCONTINUED] Calcium Carbonate-Vitamin D (CALCIUM 600+D) 600-400 MG-UNIT per tablet Take 1 tablet by mouth daily.  . [DISCONTINUED] dexlansoprazole (DEXILANT) 60 MG capsule Take 1 capsule (60 mg total) by mouth daily.  . [DISCONTINUED] omeprazole (PRILOSEC) 20 MG capsule Take 1 capsule (20 mg total) by mouth daily.  . [DISCONTINUED] phenazopyridine (PYRIDIUM) 200 MG tablet Take 1 tablet (200 mg total) by mouth 3 (three) times daily.   No facility-administered encounter medications on file as of 05/05/2016.    Allergies  Allergen Reactions  . Penicillins Rash and Other (See Comments)    fever   ROS: no headaches, dizziness, URI symptoms or other complaints. Energy and moods are good.  PHYSICAL EXAM:  BP 140/80 (BP Location: Left Arm, Patient Position: Sitting, Cuff Size: Normal)   Pulse 80    Ht 5' 7"  (1.702 m)   Wt 231 lb 9.6 oz (105.1 kg)   LMP 02/10/2016 (Approximate)   BMI 36.27 kg/m   124/76 on repeat by MD Well appearing, obese female in no distress HEENT: conjunctiva and sclera are clear, EOMI Neck: No lymphadenopathy or mass Heart: regular rate and rhythm Lungs: clear bilaterally Extremities: no edema  ASSESSMENT/PLAN:  Need for immunization against rubella and mumps  Immunization due - Plan: MMR vaccine subcutaneous  Obesity (BMI 35.0-39.9 without comorbidity) - counseled re: healthy diet, portion control, accountability (rather than no carb diet which likely she cannot maintain, any wt loss like will be regained)  Not immune to mumps.  MMR given today; repeat in 1 month

## 2016-05-05 NOTE — Patient Instructions (Addendum)
Please find out if the tetanus shot you had 3/20 was Td or Tdap so that I can properly put it on your chart.  Return in 1 month for second MMR

## 2016-05-11 ENCOUNTER — Encounter: Payer: Self-pay | Admitting: Family Medicine

## 2016-06-21 ENCOUNTER — Other Ambulatory Visit: Payer: BLUE CROSS/BLUE SHIELD

## 2016-06-23 ENCOUNTER — Other Ambulatory Visit (INDEPENDENT_AMBULATORY_CARE_PROVIDER_SITE_OTHER): Payer: BLUE CROSS/BLUE SHIELD

## 2016-06-23 DIAGNOSIS — Z23 Encounter for immunization: Secondary | ICD-10-CM | POA: Diagnosis not present

## 2017-09-13 ENCOUNTER — Emergency Department
Admission: EM | Admit: 2017-09-13 | Discharge: 2017-09-13 | Disposition: A | Discharge status: Home / Self Care | Payer: 59 | Attending: Emergency Medicine | Admitting: Emergency Medicine

## 2017-09-13 ENCOUNTER — Other Ambulatory Visit: Payer: Self-pay

## 2017-09-13 ENCOUNTER — Emergency Department: Payer: 59

## 2017-09-13 DIAGNOSIS — R079 Chest pain, unspecified: Secondary | ICD-10-CM | POA: Insufficient documentation

## 2017-09-13 DIAGNOSIS — Z87891 Personal history of nicotine dependence: Secondary | ICD-10-CM | POA: Insufficient documentation

## 2017-09-13 DIAGNOSIS — R112 Nausea with vomiting, unspecified: Secondary | ICD-10-CM | POA: Insufficient documentation

## 2017-09-13 DIAGNOSIS — R42 Dizziness and giddiness: Secondary | ICD-10-CM | POA: Insufficient documentation

## 2017-09-13 LAB — TROPONIN I
Troponin I: <0.03 ng/mL (ref <0.03)
Troponin I: <0.03 ng/mL (ref <0.03)

## 2017-09-13 LAB — CBC
HEMATOCRIT: 40.8 % (ref 35.0–47.0)
Hemoglobin: 14.5 g/dL (ref 12.0–16.0)
MCH: 32.6 pg (ref 26.0–34.0)
MCHC: 35.6 g/dL (ref 32.0–36.0)
MCV: 91.5 fL (ref 80.0–100.0)
PLATELETS: 215 10*3/uL (ref 150–440)
RBC: 4.45 MIL/uL (ref 3.80–5.20)
RDW: 12.7 % (ref 11.5–14.5)
WBC: 5.9 10*3/uL (ref 3.6–11.0)

## 2017-09-13 LAB — COMPREHENSIVE METABOLIC PANEL
ALBUMIN: 4 g/dL (ref 3.5–5.0)
ALK PHOS: 56 U/L (ref 38–126)
ALT: 17 U/L (ref 0–44)
ANION GAP: 7 (ref 5–15)
AST: 27 U/L (ref 15–41)
BUN: 11 mg/dL (ref 6–20)
CALCIUM: 8.8 mg/dL — AB (ref 8.9–10.3)
CO2: 24 mmol/L (ref 22–32)
Chloride: 110 mmol/L (ref 98–111)
Creatinine, Ser: 0.69 mg/dL (ref 0.44–1.00)
GFR calc Af Amer: >60 mL/min (ref >60)
GFR calc non Af Amer: >60 mL/min (ref >60)
GLUCOSE: 92 mg/dL (ref 70–99)
Potassium: 3.9 mmol/L (ref 3.5–5.1)
SODIUM: 141 mmol/L (ref 135–145)
Total Bilirubin: 0.6 mg/dL (ref 0.3–1.2)
Total Protein: 6.5 g/dL (ref 6.5–8.1)

## 2017-09-13 MED ORDER — DIAZEPAM 5 MG PO TABS: 5.0000 mg | ORAL_TABLET | Freq: Three times a day (TID) | ORAL | 0 refills | Status: DC | PRN

## 2017-09-13 MED ORDER — FAMOTIDINE 20 MG PO TABS: 20.0000 mg | ORAL_TABLET | Freq: Two times a day (BID) | ORAL | 1 refills | Status: DC

## 2017-09-13 MED ORDER — ONDANSETRON 4 MG PO TBDP
4.0000 mg | ORAL_TABLET | Freq: Once | ORAL | Status: AC
  Administered 2017-09-13: 4 mg via ORAL
  Filled 2017-09-13: qty 1

## 2017-09-13 NOTE — ED Notes (Signed)
First Nurse Note: patient complaining of dizziness/lightheadedness starting on Sunday, chest pain / tightness beginning this AM.  Alert and oriented, color good, NAD.  Placed in WC.

## 2017-09-13 NOTE — ED Provider Notes (Signed)
[facility] Emergency Department Provider Note       Time seen: ----------------------------------------- 9:31 AM on 09/13/2017 -----------------------------------------   I have reviewed the triage vital signs and the nursing notes.  HISTORY   Chief Complaint Chest Pain    HPI Nancy Stevenson is a 52 y.o. female with a history of lumbar compression fracture who presents to the ED for chest pain and lightheadedness since last night.  She reports some nausea and vomiting yesterday.  She reports squeezing and tightness in the center of her chest.  Symptoms started on Sunday with lightheadedness and today with the chest tightness.  She denies any other symptoms.  Patient states she had this happen once years ago but was not diagnosed with anything specific.  Past Medical History:  Diagnosis Date  . Compression fracture of lumbar vertebra (HCC) 03/2011   L2  . Seasonal allergies    summer    There are no active problems to display for this patient.   Past Surgical History:  Procedure Laterality Date  . CESAREAN SECTION     x4  . ESSURE TUBAL LIGATION  06/2007   Dr. Jackelyn Knife  . WISDOM TOOTH EXTRACTION      Allergies Penicillins  Social History Social History   Tobacco Use  . Smoking status: Former Smoker    Packs/day: 0.20    Years: 10.00    Pack years: 2.00    Types: Cigarettes    Last attempt to quit: 01/07/2010    Years since quitting: 7.6  . Smokeless tobacco: Never Used  Substance Use Topics  . Alcohol use: Yes    Alcohol/week: 0.0 standard drinks    Comment: 0-1 drinks per week.  . Drug use: No   Review of Systems Constitutional: Negative for fever. Cardiovascular: Positive for chest pain Respiratory: Negative for shortness of breath. Gastrointestinal: Negative for abdominal pain, vomiting and diarrhea. Musculoskeletal: Negative for back pain. Skin: Negative for rash. Neurological: Negative for headaches, focal weakness or  numbness.  Positive for dizziness  All systems negative/normal/unremarkable except as stated in the HPI  ____________________________________________   PHYSICAL EXAM:  VITAL SIGNS: ED Triage Vitals  Enc Vitals Group     BP 09/13/17 0853 139/85     Pulse Rate 09/13/17 0853 68     Resp 09/13/17 0858 15     Temp 09/13/17 0853 97.7 F (36.5 C)     Temp Source 09/13/17 0853 Oral     SpO2 09/13/17 0853 98 %     Weight 09/13/17 0853 235 lb (106.6 kg)     Height 09/13/17 0853 5\' 9"  (1.753 m)     Head Circumference --      Peak Flow --      Pain Score 09/13/17 0859 6     Pain Loc --      Pain Edu? --      Excl. in GC? --    Constitutional: Alert and oriented. Well appearing and in no distress. Eyes: Conjunctivae are normal. Normal extraocular movements. ENT   Head: Normocephalic and atraumatic.   Nose: No congestion/rhinnorhea.   Mouth/Throat: Mucous membranes are moist.   Neck: No stridor. Cardiovascular: Normal rate, regular rhythm. No murmurs, rubs, or gallops. Respiratory: Normal respiratory effort without tachypnea nor retractions. Breath sounds are clear and equal bilaterally. No wheezes/rales/rhonchi. Gastrointestinal: Soft and nontender. Normal bowel sounds Musculoskeletal: Nontender with normal range of motion in extremities. No lower extremity tenderness nor edema. Neurologic:  Normal speech and language. No gross focal  neurologic deficits are appreciated.  Skin:  Skin is warm, dry and intact. No rash noted. Psychiatric: Mood and affect are normal. Speech and behavior are normal.  ____________________________________________  EKG: Interpreted by me.  Sinus rhythm rate 62 bpm, normal PR interval, normal QRS, normal QT  ____________________________________________  ED COURSE:  As part of my medical decision making, I reviewed the following data within the electronic MEDICAL RECORD NUMBER History obtained from family if available, nursing notes, old chart and  ekg, as well as notes from prior ED visits. Patient presented for chest pain, we will assess with labs and imaging as indicated at this time.   Procedures ____________________________________________   LABS (pertinent positives/negatives)  Labs Reviewed  COMPREHENSIVE METABOLIC PANEL - Abnormal; Notable for the following components:      Result Value   Calcium 8.8 (*)    All other components within normal limits  CBC  TROPONIN I  TROPONIN I  POC URINE PREG, ED    RADIOLOGY Images were viewed by me  Chest x-ray IMPRESSION: There is no active cardiopulmonary disease. ____________________________________________  DIFFERENTIAL DIAGNOSIS   Musculoskeletal pain, GERD, anxiety, unstable angina  FINAL ASSESSMENT AND PLAN  Chest pain   Plan: The patient had presented for nonspecific chest pain. Patient's labs were negative including repeat troponin. Patient's imaging was also unremarkable.  Unclear etiology for her chest pain.  We will try anxiolytics and antacids.  She is cleared for outpatient follow-up.   Ulice Dash, MD   Note: This note was generated in part or whole with voice recognition software. Voice recognition is usually quite accurate but there are transcription errors that can and very often do occur. I apologize for any typographical errors that were not detected and corrected.     Emily Filbert, MD 09/13/17 [id number]

## 2017-09-13 NOTE — ED Notes (Signed)
EDP at bedside  

## 2017-09-13 NOTE — ED Triage Notes (Signed)
Pt c/o chest pain and lightheadedness that started last night - reports N/V yesterday - denies pain radiation

## 2017-09-15 ENCOUNTER — Emergency Department (HOSPITAL_COMMUNITY)
Admission: EM | Admit: 2017-09-15 | Discharge: 2017-09-15 | Disposition: A | Discharge status: Home / Self Care | Payer: 59 | Attending: Emergency Medicine | Admitting: Emergency Medicine

## 2017-09-15 ENCOUNTER — Other Ambulatory Visit: Payer: Self-pay

## 2017-09-15 ENCOUNTER — Encounter (HOSPITAL_COMMUNITY): Payer: Self-pay

## 2017-09-15 ENCOUNTER — Emergency Department (HOSPITAL_COMMUNITY): Payer: 59

## 2017-09-15 DIAGNOSIS — R079 Chest pain, unspecified: Secondary | ICD-10-CM | POA: Diagnosis not present

## 2017-09-15 DIAGNOSIS — Z87891 Personal history of nicotine dependence: Secondary | ICD-10-CM | POA: Diagnosis not present

## 2017-09-15 LAB — CBC
HCT: 42.6 % (ref 36.0–46.0)
HEMOGLOBIN: 14.5 g/dL (ref 12.0–15.0)
MCH: 31.7 pg (ref 26.0–34.0)
MCHC: 34 g/dL (ref 30.0–36.0)
MCV: 93 fL (ref 78.0–100.0)
Platelets: 227 10*3/uL (ref 150–400)
RBC: 4.58 MIL/uL (ref 3.87–5.11)
RDW: 12 % (ref 11.5–15.5)
WBC: 5.7 10*3/uL (ref 4.0–10.5)

## 2017-09-15 LAB — BASIC METABOLIC PANEL
ANION GAP: 8 (ref 5–15)
BUN: 10 mg/dL (ref 6–20)
CALCIUM: 9 mg/dL (ref 8.9–10.3)
CO2: 25 mmol/L (ref 22–32)
CREATININE: 0.9 mg/dL (ref 0.44–1.00)
Chloride: 108 mmol/L (ref 98–111)
GLUCOSE: 116 mg/dL — AB (ref 70–99)
Potassium: 3.6 mmol/L (ref 3.5–5.1)
SODIUM: 141 mmol/L (ref 135–145)

## 2017-09-15 LAB — I-STAT BETA HCG BLOOD, ED (MC, WL, AP ONLY): I-stat hCG, quantitative: <5 m[IU]/mL (ref <5)

## 2017-09-15 LAB — I-STAT TROPONIN, ED
TROPONIN I, POC: 0 ng/mL (ref 0.00–0.08)
TROPONIN I, POC: 0 ng/mL (ref 0.00–0.08)

## 2017-09-15 LAB — D-DIMER, QUANTITATIVE (NOT AT ARMC)

## 2017-09-15 MED ORDER — ONDANSETRON HCL 4 MG/2ML IJ SOLN
4.0000 mg | Freq: Once | INTRAMUSCULAR | Status: AC
  Administered 2017-09-15: 4 mg via INTRAVENOUS
  Filled 2017-09-15: qty 2

## 2017-09-15 MED ORDER — HYDROCODONE-ACETAMINOPHEN 5-325 MG PO TABS: 1.0000 | ORAL_TABLET | Freq: Four times a day (QID) | ORAL | 0 refills | Status: DC | PRN

## 2017-09-15 MED ORDER — GI COCKTAIL ~~LOC~~
30.0000 mL | Freq: Once | ORAL | Status: AC
  Administered 2017-09-15: 30 mL via ORAL
  Filled 2017-09-15: qty 30

## 2017-09-15 MED ORDER — FENTANYL CITRATE (PF) 100 MCG/2ML IJ SOLN
50.0000 ug | Freq: Once | INTRAMUSCULAR | Status: AC
  Administered 2017-09-15: 50 ug via INTRAVENOUS
  Filled 2017-09-15: qty 2

## 2017-09-15 MED ORDER — SODIUM CHLORIDE 0.9 % IV BOLUS
1000.0000 mL | Freq: Once | INTRAVENOUS | Status: AC
  Administered 2017-09-15: 1000 mL via INTRAVENOUS

## 2017-09-15 MED ORDER — MECLIZINE HCL 25 MG PO TABS
25.0000 mg | ORAL_TABLET | Freq: Once | ORAL | Status: AC
  Administered 2017-09-15: 25 mg via ORAL
  Filled 2017-09-15: qty 1

## 2017-09-15 NOTE — ED Triage Notes (Signed)
Pt endorses chest pain, dizziness, and has been fainting since Sunday. Went to [facility] 2 days ago and has an appointment with cardiology next week. VSS.

## 2017-09-15 NOTE — Discharge Instructions (Signed)
You can take Tylenol or Ibuprofen as directed for pain. You can alternate Tylenol and Ibuprofen every 4 hours. If you take Tylenol at 1pm, then you can take Ibuprofen at 5pm. Then you can take Tylenol again at 9pm.   You can take the pain medication for severe or breakthrough pain.   Follow-up with your cardiologist as previously scheduled.  Return to the Emergency Department immediately if you experiencing worsening chest pain, difficulty breathing, nausea/vomiting, get very sweaty, headache or any other worsening or concerning symptoms.

## 2017-09-15 NOTE — ED Provider Notes (Signed)
[facility] EMERGENCY DEPARTMENT Provider Note   CSN: [id number] Arrival date & time: 09/15/17  0944     History   Chief Complaint Chief Complaint  Patient presents with  . Chest Pain  . Dizziness    HPI Nancy Stevenson is a 52 y.o. female with PMH/o L2 Compression fracture who presents for evaluation of chest pain and dizziness that has been ongoing for the last 4 days.  Patient reports that initial onset of symptoms, she went to [facility] on 09/13/2017 where she had an unremarkable work-up.  Patient was discharged home with Pepcid and was instructed to follow-up with referred outpatient cardiology.  She reports she has a outpatient appointment with cardiology at the [facility] on 09/19/17.  Patient states that her chest pain had eased up yesterday but she states that she had still been having it.  She reports that worsen around 6 or 7 AM this morning.  She describes it as a midsternal "sharpness."  She states she had been taking the Pepcid she was prescribed and reported that it had been helping yesterday but reports that when she took it today, did not help.  She states that pain is not worse with deep inspiration or exertion.  She has had some associated nausea but denies any diaphoresis.  Additionally, patient reports some dizziness that she endorses as a "feeling drunk like everything is spinning sensation."  She states that the dizziness is worse when walking, getting up from a sitting position.  She states that this morning, when she stood up from the couch, she felt very dizzy and fell back against the count.  She states that she did not lose consciousness and denies any syncopal episodes.  She reports some associated blurry vision with her dizziness.  Her chest pain has remained constant since this morning, prompting ED visit. She denies any OCP or exogenous hormone use, recent immobilization, prior history of DVT/PE, recent surgery, leg swelling,  or long travel.  Patient denies any personal history of hypertension, diabetes, cardiac issues.  She denies any family cardiac history. Patient denies any speech difficulty, abdominal pain, numbness/weakness.   The history is provided by the patient.    Past Medical History:  Diagnosis Date  . Compression fracture of lumbar vertebra (HCC) 03/2011   L2  . Seasonal allergies    summer    There are no active problems to display for this patient.   Past Surgical History:  Procedure Laterality Date  . CESAREAN SECTION     x4  . ESSURE TUBAL LIGATION  06/2007   Dr. Jackelyn Knife  . WISDOM TOOTH EXTRACTION       OB History    Gravida  4   Para  4   Term      Preterm      AB      Living  5     SAB      TAB      Ectopic      Multiple      Live Births           Obstetric Comments  Twins with first pregnancy         Home Medications    Prior to Admission medications   Medication Sig Start Date End Date Taking? Authorizing Provider  famotidine (PEPCID) 20 MG tablet Take 1 tablet (20 mg total) by mouth 2 (two) times daily. 09/13/17  Yes Emily Filbert, MD  Multiple Vitamin Stevphen Meuse WITH  MINERALS) TABS Take 1 tablet by mouth daily.   Yes [provider]  diazepam (VALIUM) 5 MG tablet Take 1 tablet (5 mg total) by mouth every 8 (eight) hours as needed for muscle spasms. Patient not taking: Reported on 09/15/2017 09/13/17   Emily Filbert, MD  HYDROcodone-acetaminophen (NORCO/VICODIN) 5-325 MG tablet Take 1-2 tablets by mouth every 6 (six) hours as needed. 09/15/17   Maxwell Caul, PA-C    Family History Family History  Problem Relation Age of Onset  . Stroke Mother   . Hypertension Mother   . Kidney disease Mother        dialysis  . Heart disease Father 70       MI  . Hypertension Father   . Diabetes Paternal Aunt   . Cancer Neg Hx     Social History Social History   Tobacco Use  . Smoking status: Former Smoker     Packs/day: 0.20    Years: 10.00    Pack years: 2.00    Types: Cigarettes    Last attempt to quit: 01/07/2010    Years since quitting: 7.6  . Smokeless tobacco: Never Used  Substance Use Topics  . Alcohol use: Yes    Alcohol/week: 0.0 standard drinks    Comment: 0-1 drinks per week.  . Drug use: No     Allergies   Other and Penicillins   Review of Systems Review of Systems  Constitutional: Negative for fever.  Eyes: Positive for visual disturbance.  Respiratory: Negative for cough and shortness of breath.   Cardiovascular: Positive for chest pain.  Gastrointestinal: Negative for abdominal pain, nausea and vomiting.  Genitourinary: Negative for dysuria and hematuria.  Neurological: Positive for dizziness and light-headedness. Negative for facial asymmetry, speech difficulty, weakness and headaches.  All other systems reviewed and are negative.    Physical Exam Updated Vital Signs BP (!) 109/52   Pulse (!) 56   Temp 98.3 F (36.8 C) (Oral)   Resp 16   Ht 5\' 9"  (1.753 m)   Wt 107 kg   LMP 03/15/2017 (Approximate) Comment: tubal ligation  SpO2 100%   BMI 34.85 kg/m   Physical Exam  Constitutional: She is oriented to person, place, and time. She appears well-developed and well-nourished.  HENT:  Head: Normocephalic and atraumatic.  Mouth/Throat: Oropharynx is clear and moist and mucous membranes are normal.  Eyes: Pupils are equal, round, and reactive to light. Conjunctivae and lids are normal. Right eye exhibits nystagmus. Left eye exhibits nystagmus.  Horizontal nystagmus noted to the left. No vertical or rotational nystagmus.   Neck: Full passive range of motion without pain.  Cardiovascular: Normal rate, regular rhythm, normal heart sounds and normal pulses. Exam reveals no gallop and no friction rub.  No murmur heard. Pulses:      Radial pulses are 2+ on the right side, and 2+ on the left side.       Dorsalis pedis pulses are 2+ on the right side, and 2+ on the  left side.  Pulmonary/Chest: Effort normal and breath sounds normal.  Lungs clear to auscultation bilaterally.  Symmetric chest rise.  No wheezing, rales, rhonchi.  Pain is reproduced with palpation of the chest or movement of the extremities.  Abdominal: Soft. Normal appearance. There is no tenderness. There is no rigidity and no guarding.  Musculoskeletal: Normal range of motion.  Neurological: She is alert and oriented to person, place, and time. GCS eye subscore is 4. GCS verbal subscore is 5.  GCS motor subscore is 6.  Cranial nerves III-XII intact Follows commands, Moves all extremities  5/5 strength to BUE and BLE  Sensation intact throughout all major nerve distributions Normal finger to nose. No dysdiadochokinesia. No pronator drift. No slurred speech. No facial droop No gait ataxia.   Skin: Skin is warm and dry. Capillary refill takes less than 2 seconds.  Psychiatric: She has a normal mood and affect. Her speech is normal.  Nursing note and vitals reviewed.    ED Treatments / Results  Labs (all labs ordered are listed, but only abnormal results are displayed) Labs Reviewed  BASIC METABOLIC PANEL - Abnormal; Notable for the following components:      Result Value   Glucose, Bld 116 (*)    All other components within normal limits  CBC  D-DIMER, QUANTITATIVE (NOT AT [facility])  I-STAT TROPONIN, ED  I-STAT BETA HCG BLOOD, ED ([facility], [facility], [facility] ONLY)  I-STAT TROPONIN, ED    EKG EKG Interpretation  Date/Time:  Friday September 15 2017 19:53:49 EDT Ventricular Rate:  53 PR Interval:  196 QRS Duration: 94 QT Interval:  464 QTC Calculation: 435 R Axis:   64 Text Interpretation:  Sinus bradycardia with sinus arrhythmia Otherwise normal ECG similar to prior 9/19 Confirmed by Meridee Score [id number]) on 09/15/2017 8:11:19 PM   Radiology Dg Chest 2 View  Result Date: 09/15/2017 CLINICAL DATA:  Chest pain and shortness of breath EXAM: CHEST - 2 VIEW COMPARISON:  September 13, 2017  FINDINGS: There is no edema or consolidation. The heart size and pulmonary vascularity are normal. No adenopathy. No pneumothorax. No bone lesions. IMPRESSION: No edema or consolidation. Electronically Signed   By: Bretta Bang III M.D.   On: 09/15/2017 10:38    Procedures Procedures (including critical care time)  Medications Ordered in ED Medications  sodium chloride 0.9 % bolus 1,000 mL (0 mLs Intravenous Stopped 09/15/17 1755)  ondansetron (ZOFRAN) injection 4 mg (4 mg Intravenous Given 09/15/17 1648)  meclizine (ANTIVERT) tablet 25 mg (25 mg Oral Given 09/15/17 1648)  gi cocktail (Maalox,Lidocaine,Donnatal) (30 mLs Oral Given 09/15/17 1648)  fentaNYL (SUBLIMAZE) injection 50 mcg (50 mcg Intravenous Given 09/15/17 1818)     Initial Impression / Assessment and Plan / ED Course  I have reviewed the triage vital signs and the nursing notes.  Pertinent labs & imaging results that were available during my care of the patient were reviewed by me and considered in my medical decision making (see chart for details).     52 y.o. female who presents for evaluation of chest pain and dizziness.  The symptoms have been ongoing for the last several days.  She was seen at [facility] on 09/13/2017 for evaluation of symptoms.  Discharged home after negative work-up.  Is scheduled to follow-up with outpatient cardiology on 09/19/2017.  Came today because symptoms are worsening.  She reports chest pain has been constant since this morning.  Additionally reports some dizziness and nausea.  Chest pain is not worse with deep inspiration or exertion.  No associated diaphoresis or shortness of breath.  Consider ACS etiology versus Unstable angina versus infectious etiology versus electrolyte imbalance versus reflux versus anxiety versus musculoskeletal vs GERD.  Consider orthostatic hypotension versus vertigo.  History/physical exam is not concerning for PE, aortic dissection.  Initial labs and imaging ordered at  triage.  I-STAT beta negative.  CBC without any significant leukocytosis or anemia.  BMP shows glucose of 116 otherwise unremarkable.  Chest x-ray negative for any acute  infectious etiology.  Initial troponin negative.  HEART Score is maximum of 2. Delta troponin sent. D-dimer negative.  Repeat troponin negative.  Discussed results with patient.  She reports she is still having chest pain and was not improved after GI cocktail or pain medications here in the ED.  She reports dizziness and nausea has improved significantly.  At this time, she is slightly bradycardic.  I do not see any evidence of blocks on her EKG.  Additionally, since dizziness and nausea improved after meclizine and fluids, do not suspect that patient is suffering from symptomatic bradycardia.  When patient did walk, she did not have any dizziness or nausea though she stated that her chest pain was slightly worse when she walked around.  Even with this new description of pain, her maximum heart score is 3.  Repeat EKG shows normal sinus rhythm.  No other maladies.  Given 2- troponins, 2 unremarkable EKGs and a low risk heart score at this time, do not feel that patient needs inpatient admission for further evaluation.  Discussed with Dr. Charm BargesButler who is in agreement.  Discussed plan with patient.  Instructed patient to follow-up with cardiology as directed on 09/19/2017. Patient had ample opportunity for questions and discussion. All patient's questions were answered with full understanding. Strict return precautions discussed. Patient expresses understanding and agreement to plan.   Final Clinical Impressions(s) / ED Diagnoses   Final diagnoses:  Chest pain, unspecified type    ED Discharge Orders         Ordered    HYDROcodone-acetaminophen (NORCO/VICODIN) 5-325 MG tablet  Every 6 hours PRN     09/15/17 2035           Maxwell CaulLayden, Lindsey A, PA-C 09/15/17 2345    Terrilee FilesButler, Michael C, MD 09/16/17 [phone]
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
## 2017-10-10 ENCOUNTER — Encounter: Payer: Self-pay | Admitting: Family Medicine

## 2017-10-10 ENCOUNTER — Ambulatory Visit: Payer: 59 | Admitting: Family Medicine

## 2017-10-10 VITALS — BP 128/84 | HR 60 | Ht 69.0 in | Wt 241.2 lb

## 2017-10-10 DIAGNOSIS — Z6835 Body mass index (BMI) 35.0-35.9, adult: Secondary | ICD-10-CM

## 2017-10-10 DIAGNOSIS — K219 Gastro-esophageal reflux disease without esophagitis: Secondary | ICD-10-CM

## 2017-10-10 DIAGNOSIS — R0602 Shortness of breath: Secondary | ICD-10-CM | POA: Diagnosis not present

## 2017-10-10 DIAGNOSIS — R319 Hematuria, unspecified: Secondary | ICD-10-CM

## 2017-10-10 DIAGNOSIS — Z23 Encounter for immunization: Secondary | ICD-10-CM

## 2017-10-10 DIAGNOSIS — R300 Dysuria: Secondary | ICD-10-CM | POA: Diagnosis not present

## 2017-10-10 LAB — POCT URINALYSIS DIP (PROADVANTAGE DEVICE)
BILIRUBIN UA: NEGATIVE mg/dL
Bilirubin, UA: NEGATIVE
Glucose, UA: NEGATIVE mg/dL
Leukocytes, UA: NEGATIVE
Nitrite, UA: NEGATIVE
PROTEIN UA: NEGATIVE mg/dL
SPECIFIC GRAVITY, URINE: 1.025
UUROB: NEGATIVE
pH, UA: 5.5 (ref 5.0–8.0)

## 2017-10-10 MED ORDER — OMEPRAZOLE 40 MG PO CPDR: 40.0000 mg | DELAYED_RELEASE_CAPSULE | Freq: Every day | ORAL | 1 refills | Status: AC

## 2017-10-10 NOTE — Patient Instructions (Addendum)
Try elevated the head of the bed some. Continue to make some of the dietary changes and lose weight as we discussed.  Stop the Pepcid.  Instead take omeprazole 40mg  once daily. Start by taking it before breakfast. If symptoms are worse at night, change it to before dinnertime instead.   Food Choices for Gastroesophageal Reflux Disease, Adult When you have gastroesophageal reflux disease (GERD), the foods you eat and your eating habits are very important. Choosing the right foods can help ease the discomfort of GERD. Consider working with a diet and nutrition specialist (dietitian) to help you make healthy food choices. What general guidelines should I follow? Eating plan  Choose healthy foods low in fat, such as fruits, vegetables, whole grains, low-fat dairy products, and lean meat, fish, and poultry.  Eat frequent, small meals instead of three large meals each day. Eat your meals slowly, in a relaxed setting. Avoid bending over or lying down until 2-3 hours after eating.  Limit high-fat foods such as fatty meats or fried foods.  Limit your intake of oils, butter, and shortening to less than 8 teaspoons each day.  Avoid the following: ? Foods that cause symptoms. These may be different for different people. Keep a food diary to keep track of foods that cause symptoms. ? Alcohol. ? Drinking large amounts of liquid with meals. ? Eating meals during the 2-3 hours before bed.  Cook foods using methods other than frying. This may include baking, grilling, or broiling. Lifestyle   Maintain a healthy weight. Ask your health care provider what weight is healthy for you. If you need to lose weight, work with your health care provider to do so safely.  Exercise for at least 30 minutes on 5 or more days each week, or as told by your health care provider.  Avoid wearing clothes that fit tightly around your waist and chest.  Do not use any products that contain nicotine or tobacco, such as  cigarettes and e-cigarettes. If you need help quitting, ask your health care provider.  Sleep with the head of your bed raised. Use a wedge under the mattress or blocks under the bed frame to raise the head of the bed. What foods are not recommended? The items listed may not be a complete list. Talk with your dietitian about what dietary choices are best for you. Grains Pastries or quick breads with added fat. [place] toast. Vegetables Deep fried vegetables. [place] fries. Any vegetables prepared with added fat. Any vegetables that cause symptoms. For some people this may include tomatoes and tomato products, chili peppers, onions and garlic, and horseradish. Fruits Any fruits prepared with added fat. Any fruits that cause symptoms. For some people this may include citrus fruits, such as oranges, grapefruit, pineapple, and lemons. Meats and other protein foods High-fat meats, such as fatty beef or pork, hot dogs, ribs, ham, sausage, salami and bacon. Fried meat or protein, including fried fish and fried chicken. Nuts and nut butters. Dairy Whole milk and chocolate milk. Sour cream. Cream. Ice cream. Cream cheese. Milk shakes. Beverages Coffee and tea, with or without caffeine. Carbonated beverages. Sodas. Energy drinks. Fruit juice made with acidic fruits (such as orange or grapefruit). Tomato juice. Alcoholic drinks. Fats and oils Butter. Margarine. Shortening. Ghee. Sweets and desserts Chocolate and cocoa. Donuts. Seasoning and other foods Pepper. Peppermint and spearmint. Any condiments, herbs, or seasonings that cause symptoms. For some people, this may include curry, hot sauce, or vinegar-based salad dressings. Summary  When you  have gastroesophageal reflux disease (GERD), food and lifestyle choices are very important to help ease the discomfort of GERD.  Eat frequent, small meals instead of three large meals each day. Eat your meals slowly, in a relaxed setting. Avoid bending over or  lying down until 2-3 hours after eating.  Limit high-fat foods such as fatty meat or fried foods. This information is not intended to replace advice given to you by your health care provider. Make sure you discuss any questions you have with your health care provider. Document Released: 12/20/2004 Document Revised: 12/22/2015 Document Reviewed: 12/22/2015 Elsevier Interactive Patient Education  2018 [employer].   We discussed meclizine to use as needed if vertigo recurs (Dramamine-N nausea should contain this ingredient). Hopefully it has resolved and will not recur. Be sure to stay well hydrated, drinking plenty of water, to help prevent the lightheadedness.

## 2017-10-13 LAB — CULTURE, URINE COMPREHENSIVE

## 2017-11-07 NOTE — Progress Notes (Signed)
Chief Complaint  Patient presents with  . Follow-up    4 week follow up.    Patient presents for follow-up on chest pain and dyspnea.  She was last seen 10/8 at which time we elected to treat for presumptive GERD as a cause for these symptoms.  She had already had some improvement from Pepcid after ER visit (and had subsequent cardiac eval outpatient which was normal).  She was changed from pepcid to 40mg  omeprazole, and has been taking this daily since last visit.  She is doing much better, only had slight symptoms the couple of times she had wine.   Cut back on wine, as she noticed this trigger her symptoms. She is being much more diligent with her diet, drinking more water. She is walking most nights, up to 3 miles.  She was also complaining of some intermittent dysuria at last visit.  Urine culture did not show any definite infection, and was not treated Symptoms resolved.  PMH, PSH, SH reviewed  Outpatient Encounter Medications as of 11/08/2017  Medication Sig  . Multiple Vitamin (MULITIVITAMIN WITH MINERALS) TABS Take 1 tablet by mouth daily.  Marland Kitchen omeprazole (PRILOSEC) 40 MG capsule Take 1 capsule (40 mg total) by mouth daily.   No facility-administered encounter medications on file as of 11/08/2017.    Allergies  Allergen Reactions  . Other Other (See Comments)    NO NARCOTICS - patient preference  . Penicillins Rash and Other (See Comments)    Fever Has patient had a PCN reaction causing immediate rash, facial/tongue/throat swelling, SOB or lightheadedness with hypotension: Yes Has patient had a PCN reaction causing severe rash involving mucus membranes or skin necrosis: No Has patient had a PCN reaction that required hospitalization: No Has patient had a PCN reaction occurring within the last 10 years: No If all of the above answers are "NO", then may proceed with Cephalosporin use.    ROS: no fever, chills, URI symptoms, chest pain, shortness of breath, nausea, bowel changes  or other concerns.  See HPI.  Feels much better, energy is improved.   PHYSICAL EXAM:  BP 132/86   Pulse 80   Ht 5\' 9"  (1.753 m)   Wt 236 lb 12.8 oz (107.4 kg)   BMI 34.97 kg/m    120/78 on repeat by MD  Wt Readings from Last 3 Encounters:  11/08/17 236 lb 12.8 oz (107.4 kg)  10/10/17 241 lb 3.2 oz (109.4 kg)  09/15/17 236 lb (107 kg)   Well appearing, pleasant female, in no distress, in good spirits HEENT: conjunctiva and sclera clear, EOMI Neck: no lymphadenopathy or mass Heart: regular rate and rhythm Lungs: clear bilaterally Abdomen: soft, nontender, no mass Extremities: no edema. Psych: normal mood, affect, hygiene and grooming  ASSESSMENT/PLAN:   Gastroesophageal reflux disease, esophagitis presence not specified - dyspnea and chest pain resolved; continue PPI, try and taper. Continue reflux precautions, weight loss.   The goal is to be on the lowest dose needed to control your symptoms. For now, consider going every other day with the 40mg  prescription.  If you have no recurrent symptoms on the day you don't take it, you can try backing down further.  The goal would be to maybe use it just prior to a meal that you know will trigger reflux (ie spicy, alcohol, large/late meal, etc). If you don't get the medicine on board PRIOR, and you develop symptoms, use either maalox, mylanta or pepcid complete as these will help with symptoms faster than  taking the omeprazole or prilosec at the time of symptoms.  Ideally, wean down to Prilosec OTC or pepcid, prior to the triggering foods. If you still need to use medication daily, and if needing longterm, then we may suggest additional vitamins (B12, Magnesium, iron, calcium)  Cut back on the carbs as we discussed. Continue daily exercise.

## 2017-11-08 ENCOUNTER — Encounter: Payer: Self-pay | Admitting: Family Medicine

## 2017-11-08 ENCOUNTER — Ambulatory Visit: Payer: 59 | Admitting: Family Medicine

## 2017-11-08 VITALS — BP 120/78 | HR 80 | Ht 69.0 in | Wt 236.8 lb

## 2017-11-08 DIAGNOSIS — K219 Gastro-esophageal reflux disease without esophagitis: Secondary | ICD-10-CM | POA: Diagnosis not present

## 2017-11-08 NOTE — Patient Instructions (Signed)
The goal is to be on the lowest dose needed to control your symptoms. For now, consider going every other day with the 40mg  prescription.  If you have no recurrent symptoms on the day you don't take it, you can try backing down further.  The goal would be to maybe use it just prior to a meal that you know will trigger reflux (ie spicy, alcohol, large/late meal, etc). If you don't get the medicine on board PRIOR, and you develop symptoms, use either maalox, mylanta or pepcid complete as these will help with symptoms faster than taking the omeprazole or prilosec at the time of symptoms.  Ideally, wean down to Prilosec OTC or pepcid, prior to the triggering foods. If you still need to use medication daily, and if needing longterm, then we may suggest additional vitamins (B12, Magnesium, iron, calcium)  Cut back on the carbs as we discussed. Continue daily exercise.     MyPlate from USDA The general, healthful diet is based on the 2010 Dietary Guidelines for Americans. The amount of food you need to eat from each food group depends on your age, sex, and level of physical activity and can be individualized by a dietitian. Go to https://www.bernard.org/ for more information. What do I need to know about the MyPlate plan?  Enjoy your food, but eat less.  Avoid oversized portions. ?  of your plate should include fruits and vegetables. ?  of your plate should be grains. ?  of your plate should be protein. Grains  Make at least half of your grains whole grains.  For a 2,000 calorie daily food plan, eat 6 oz every day.  1 oz is about 1 slice bread, 1 cup cereal, or  cup cooked rice, cereal, or pasta. Vegetables  Make half your plate fruits and vegetables.  For a 2,000 calorie daily food plan, eat 2 cups every day.  1 cup is about 1 cup raw or cooked vegetables or vegetable juice or 2 cups raw leafy greens. Fruits  Make half your plate fruits and vegetables.  For a 2,000 calorie daily  food plan, eat 2 cups every day.  1 cup is about 1 cup fruit or 100% fruit juice or  cup dried fruit. Protein  For a 2,000 calorie daily food plan, eat 5 oz every day.  1 oz is about 1 oz meat, poultry, or fish,  cup cooked beans, 1 egg, 1 Tbsp peanut butter, or  oz nuts or seeds. Dairy  Switch to fat-free or low-fat (1%) milk.  For a 2,000 calorie daily food plan, eat 3 cups every day.  1 cup is about 1 cup milk or yogurt or soy milk (soy beverage), 1 oz natural cheese, or 2 oz processed cheese. Fats, Oils, and Empty Calories  Only small amounts of oils are recommended.  Empty calories are calories from solid fats or added sugars.  Compare sodium in foods like soup, bread, and frozen meals. Choose the foods with lower numbers.  Drink water instead of sugary drinks. What foods can I eat? Grains Whole grains such as whole wheat, quinoa, millet, and bulgur. Bread, rolls, and pasta made from whole grains. Brown or wild rice. Hot or cold cereals made from whole grains and without added sugar. Vegetables All fresh vegetables, especially fresh red, dark green, or orange vegetables. Peas and beans. Low-sodium frozen or canned vegetables prepared without added salt. Low-sodium vegetable juices. Fruits All fresh, frozen, and dried fruits. Canned fruit packed in water or fruit  juice without added sugar. Fruit juices without added sugar. Meats and Other Protein Sources Boiled, baked, or grilled lean meat trimmed of fat. Skinless poultry. Fresh seafood and shellfish. Canned seafood packed in water. Unsalted nuts and unsalted nut butters. Tofu. Dried beans and pea. Eggs. Dairy Low-fat or fat-free milk, yogurt, and cheeses. Sweets and Desserts Frozen desserts made from low-fat milk. Fats and Oils Olive, peanut, and canola oils and margarine. Salad dressing and mayonnaise made from these oils. Other Soups and casseroles made from allowed ingredients and without added fat or salt. The  items listed above may not be a complete list of recommended foods or beverages. Contact your dietitian for more options. What foods are not recommended? Grains Sweetened, low-fiber cereals. Packaged baked goods. Snack crackers and chips. Cheese crackers, butter crackers, and biscuits. Frozen waffles, sweet breads, doughnuts, pastries, packaged baking mixes, pancakes, cakes, and cookies. Vegetables Regular canned or frozen vegetables or vegetables prepared with salt. Canned tomatoes. Canned tomato sauce. Fried vegetables. Vegetables in cream sauce or cheese sauce. Fruits Fruits packed in syrup or made with added sugar. Meats and Other Protein Sources Marbled or fatty meats such as ribs. Poultry with skin. Fried meats, poultry, eggs, or fish. Sausages, hot dogs, and deli meats such as pastrami, bologna, or salami. Dairy Whole milk, cream, cheeses made from whole milk, sour cream. Ice cream or yogurt made from whole milk or with added sugar. Beverages For adults, no more than one alcoholic drink per day. Regular soft drinks or other sugary beverages. Juice drinks. Sweets and Desserts Sugary or fatty desserts, candy, and other sweets. Fats and Oils Solid shortening or partially hydrogenated oils. Solid margarine. Margarine that contains trans fats. Butter. The items listed above may not be a complete list of foods and beverages to avoid. Contact your dietitian for more information. This information is not intended to replace advice given to you by your health care provider. Make sure you discuss any questions you have with your health care provider. Document Released: 01/09/2007 Document Revised: 05/28/2015 Document Reviewed: 11/28/2012 Elsevier Interactive Patient Education  [employer].

## 2018-05-23 ENCOUNTER — Encounter: Payer: BLUE CROSS/BLUE SHIELD | Admitting: Family Medicine

## 2018-06-28 ENCOUNTER — Telehealth: Payer: Self-pay | Admitting: Family Medicine

## 2018-06-28 NOTE — Telephone Encounter (Signed)
Dismissal letter in guarantor snapshot  °

## 2018-08-08 ENCOUNTER — Emergency Department
Admission: EM | Admit: 2018-08-08 | Discharge: 2018-08-08 | Disposition: A | Discharge status: Home / Self Care | Payer: BC Managed Care – PPO | Attending: Emergency Medicine | Admitting: Emergency Medicine

## 2018-08-08 ENCOUNTER — Emergency Department: Payer: BC Managed Care – PPO

## 2018-08-08 ENCOUNTER — Other Ambulatory Visit: Payer: Self-pay

## 2018-08-08 DIAGNOSIS — R200 Anesthesia of skin: Secondary | ICD-10-CM | POA: Insufficient documentation

## 2018-08-08 DIAGNOSIS — R202 Paresthesia of skin: Secondary | ICD-10-CM

## 2018-08-08 DIAGNOSIS — Z87891 Personal history of nicotine dependence: Secondary | ICD-10-CM | POA: Insufficient documentation

## 2018-08-08 LAB — CBC
HCT: 40.5 % (ref 36.0–46.0)
Hemoglobin: 14 g/dL (ref 12.0–15.0)
MCH: 30.8 pg (ref 26.0–34.0)
MCHC: 34.6 g/dL (ref 30.0–36.0)
MCV: 89 fL (ref 80.0–100.0)
Platelets: 233 10*3/uL (ref 150–400)
RBC: 4.55 MIL/uL (ref 3.87–5.11)
RDW: 12.6 % (ref 11.5–15.5)
WBC: 5.8 10*3/uL (ref 4.0–10.5)
nRBC: 0 % (ref 0.0–0.2)

## 2018-08-08 LAB — COMPREHENSIVE METABOLIC PANEL
ALT: 14 U/L (ref 0–44)
AST: 25 U/L (ref 15–41)
Albumin: 3.8 g/dL (ref 3.5–5.0)
Alkaline Phosphatase: 60 U/L (ref 38–126)
Anion gap: 8 (ref 5–15)
BUN: 9 mg/dL (ref 6–20)
CO2: 23 mmol/L (ref 22–32)
Calcium: 8.6 mg/dL — ABNORMAL LOW (ref 8.9–10.3)
Chloride: 108 mmol/L (ref 98–111)
Creatinine, Ser: 0.64 mg/dL (ref 0.44–1.00)
GFR calc Af Amer: >60 mL/min (ref >60)
GFR calc non Af Amer: >60 mL/min (ref >60)
Glucose, Bld: 98 mg/dL (ref 70–99)
Potassium: 3.4 mmol/L — ABNORMAL LOW (ref 3.5–5.1)
Sodium: 139 mmol/L (ref 135–145)
Total Bilirubin: 0.5 mg/dL (ref 0.3–1.2)
Total Protein: 6.4 g/dL — ABNORMAL LOW (ref 6.5–8.1)

## 2018-08-08 LAB — DIFFERENTIAL
Abs Immature Granulocytes: 0.02 10*3/uL (ref 0.00–0.07)
Basophils Absolute: 0.1 10*3/uL (ref 0.0–0.1)
Basophils Relative: 1 %
Eosinophils Absolute: 0.3 10*3/uL (ref 0.0–0.5)
Eosinophils Relative: 6 %
Immature Granulocytes: 0 %
Lymphocytes Relative: 34 %
Lymphs Abs: 2 10*3/uL (ref 0.7–4.0)
Monocytes Absolute: 0.4 10*3/uL (ref 0.1–1.0)
Monocytes Relative: 7 %
Neutro Abs: 3 10*3/uL (ref 1.7–7.7)
Neutrophils Relative %: 52 %

## 2018-08-08 LAB — APTT: aPTT: 32 seconds (ref 24–36)

## 2018-08-08 LAB — PROTIME-INR
INR: 1 (ref 0.8–1.2)
Prothrombin Time: 13 seconds (ref 11.4–15.2)

## 2018-08-08 MED ORDER — SODIUM CHLORIDE 0.9% FLUSH
3.0000 mL | Freq: Once | INTRAVENOUS | Status: DC
Start: 2018-08-08 — End: 2018-08-08

## 2018-08-08 MED ORDER — ASPIRIN 81 MG PO TBEC: 81.0000 mg | DELAYED_RELEASE_TABLET | Freq: Every day | ORAL | 12 refills | Status: AC

## 2018-08-08 NOTE — ED Provider Notes (Signed)
[facility] Emergency Department Provider Note ____________________________________________   First MD Initiated Contact with Patient 08/08/18 [phone]     (approximate)  I have reviewed the triage vital signs and the nursing notes.   HISTORY  Chief Complaint Numbness    HPI Nancy Stevenson is a 53 y.o. female with PMH as noted below who presents with left arm numbness, acute onset when she awoke this morning around 4 AM, and now resolved.  It lasted for proximally 1 hour.  She reports some persistent tingling in the left arm but her sensation has returned.  The patient denies any associated weakness, or any numbness in other parts of the body.  She had no visual changes, speech or swallowing disturbance.  She states that when she went to bed around 10:30 PM she had no symptoms.  She denies any prior history of similar neurologic or strokelike symptoms.  She states she has otherwise been in her usual state of health.  She does have a family history of stroke.  She denies any neck pain or injury.  Past Medical History:  Diagnosis Date   Compression fracture of lumbar vertebra ([place]) 03/2011   L2   Seasonal allergies    summer    There are no active problems to display for this patient.   Past Surgical History:  Procedure Laterality Date   CESAREAN SECTION     x4   ESSURE TUBAL LIGATION  06/2007   Dr. Willis Modena   WISDOM TOOTH EXTRACTION      Prior to Admission medications   Medication Sig Start Date End Date Taking? Authorizing Provider  Multiple Vitamin (MULITIVITAMIN WITH MINERALS) TABS Take 1 tablet by mouth daily.    [provider]  omeprazole (PRILOSEC) 40 MG capsule Take 1 capsule (40 mg total) by mouth daily. 10/10/17   Rita Ohara, MD    Allergies Other and Penicillins  Family History  Problem Relation Age of Onset   Stroke Mother    Hypertension Mother    Kidney disease Mother        dialysis   Heart disease Father 74    MI   Hypertension Father    Diabetes Paternal Aunt    Cancer Neg Hx     Social History Social History   Tobacco Use   Smoking status: Former Smoker    Packs/day: 0.20    Years: 10.00    Pack years: 2.00    Types: Cigarettes    Quit date: 01/07/2010    Years since quitting: 8.5   Smokeless tobacco: Never Used  Substance Use Topics   Alcohol use: Yes    Alcohol/week: 0.0 standard drinks    Comment: 0-1 drinks per week.   Drug use: No    Review of Systems  Constitutional: No fever. Eyes: No visual changes. ENT: No sore throat.  No difficulty swallowing. Cardiovascular: Denies chest pain. Respiratory: Denies shortness of breath. Gastrointestinal: No vomiting or diarrhea.  Genitourinary: Negative for flank pain.  Musculoskeletal: Negative for back pain. Skin: Negative for rash. Neurological: Negative for headache.   ____________________________________________   PHYSICAL EXAM:  VITAL SIGNS: ED Triage Vitals  Enc Vitals Group     BP 08/08/18 0524 133/72     Pulse Rate 08/08/18 0524 65     Resp 08/08/18 0524 18     Temp 08/08/18 0524 97.9 F (36.6 C)     Temp Source 08/08/18 0524 Oral     SpO2 08/08/18 0524 96 %  Weight 08/08/18 0514 248 lb (112.5 kg)     Height 08/08/18 0514 5\' 9"  (1.753 m)     Head Circumference --      Peak Flow --      Pain Score 08/08/18 0513 0     Pain Loc --      Pain Edu? --      Excl. in GC? --     Constitutional: Alert and oriented.  Relatively well appearing and in no acute distress. Eyes: Conjunctivae are normal.  EOMI.  PERRLA.   Head: Atraumatic. Nose: No congestion/rhinnorhea. Mouth/Throat: Mucous membranes are moist.   Neck: Normal range of motion.  Cardiovascular: Normal rate, regular rhythm. Good peripheral circulation. Respiratory: Normal respiratory effort.  No retractions.  Gastrointestinal: No distention.  Musculoskeletal: Extremities warm and well perfused.  Neurologic:  Normal speech and language.   5/5 motor strength and intact sensation to bilateral upper and lower extremities.  No ataxia on finger-to-nose.  No pronator drift.  Cranial nerves II through XII intact. Skin:  Skin is warm and dry. No rash noted. Psychiatric: Mood and affect are normal. Speech and behavior are normal.  ____________________________________________   LABS (all labs ordered are listed, but only abnormal results are displayed)  Labs Reviewed  COMPREHENSIVE METABOLIC PANEL - Abnormal; Notable for the following components:      Result Value   Potassium 3.4 (*)    Calcium 8.6 (*)    Total Protein 6.4 (*)    All other components within normal limits  PROTIME-INR  APTT  CBC  DIFFERENTIAL  CBG MONITORING, ED  POC URINE PREG, ED   ____________________________________________  EKG  ED ECG REPORT I, Dionne BucySebastian Alix Lahmann, the attending physician, personally viewed and interpreted this ECG.  Date: 08/08/2018 EKG Time: 0522 Rate: 63 Rhythm: normal sinus rhythm QRS Axis: normal Intervals: normal ST/T Wave abnormalities: normal Narrative Interpretation: no evidence of acute ischemia  ____________________________________________  RADIOLOGY  CT head: No acute abnormality ____________________________________________   PROCEDURES  Procedure(s) performed: No  Procedures  Critical Care performed: No ____________________________________________   INITIAL IMPRESSION / ASSESSMENT AND PLAN / ED COURSE  Pertinent labs & imaging results that were available during my care of the patient were reviewed by me and considered in my medical decision making (see chart for details).  53 year old female with PMH as noted above presents with left arm numbness which she noted acutely when she awoke this morning around 4 AM.  She had no symptoms when she went to bed around 10:30 PM last night.  She denies any associated weakness, and has no other acute neurologic symptoms.  The numbness in the arm has now resolved  after approximately 1 hour.  She has mild remaining tingling.  On exam she is overall well-appearing.  Her vital signs are normal although she states that her blood pressure normally runs low so it is slightly elevated for her.  Neurologic exam at this time is normal.  I cannot elicit any motor or sensory deficits in the left arm.  Overall presentation is concerning for possible TIA.  Since the symptoms are now resolved, or sensory only, and since the patient will cope with the symptoms, she is not a candidate for intervention and does not require code stroke activation or emergent neurology consultation.  Differential would also include cervical radiculopathy.  The patient has no weakness and no recent injury or pain in the neck.  We will obtain CT head and lab work-up.  If the CT is negative and  the I will plan for likely MRI in the ED, and then if negative she can be discharged with outpatient work-up if her symptoms do not return.  ----------------------------------------- 6:57 AM on 08/08/2018 -----------------------------------------  CT shows no acute findings.  The patient has no recurrence of her symptoms.  We will obtain an MR and then reassess.  I anticipate likely discharge home with outpatient work-up if the patient has no further symptoms and the MR is negative.  I am signing the patient out to the oncoming physician Dr. Mayford KnifeWilliams.  ____________________________________________   FINAL CLINICAL IMPRESSION(S) / ED DIAGNOSES  Final diagnoses:  Left arm numbness      NEW MEDICATIONS STARTED DURING THIS VISIT:  New Prescriptions   No medications on file     Note:  This document was prepared using Dragon voice recognition software and may include unintentional dictation errors.    Dionne BucySiadecki, Tavarious Freel, MD 08/08/18 [id number]

## 2018-08-08 NOTE — ED Notes (Signed)
Pt states numbness is only in left arm.

## 2018-08-08 NOTE — ED Triage Notes (Signed)
Pt arrives to ED via POV from home with c/o left-sided numbness x1 hr. Pt states the numbness is only in the left arm, and not the lower extremity. No facial droop, no slurred speech; no difficultly speaking or swallowing. Pt MAEW, grips are strong and equal bilaterally. Pt reports a headache yesterday, but none today/at this time. No N/V/D or fever. No CP or SHOB. Pt is A&O, in NAD; RR even, regular and unlabored.

## 2018-08-08 NOTE — ED Notes (Signed)
Patient transported to MRI 

## 2018-08-08 NOTE — ED Provider Notes (Signed)
MRI is negative, patient will be discharged home with outpatient follow-up.  I have advised a baby aspirin daily.   Earleen Newport, MD 08/08/18 [phone]

## 2019-11-11 IMAGING — CT CT HEAD WITHOUT CONTRAST
3 series · 16 of 45 positions shown, 19 images · non-contrast
Comparison: None.

CLINICAL DATA: Numbness or tingling on the left for 1 hour

EXAM:
CT HEAD WITHOUT CONTRAST
TECHNIQUE: Contiguous axial images were obtained from the base of the skull
through the vertex without intravenous contrast.

[Series 2: head wo · axial · 0.41mm/px · z∈[-98,+17]mm · 10 of 28 slices shown, 13 images]
[im 3/28  brain]
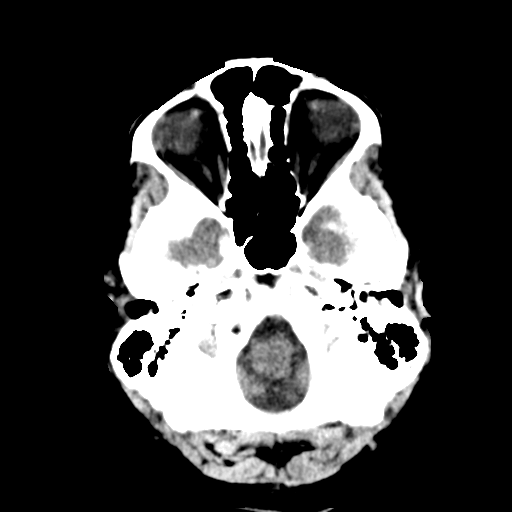
[im 3/28  bone]
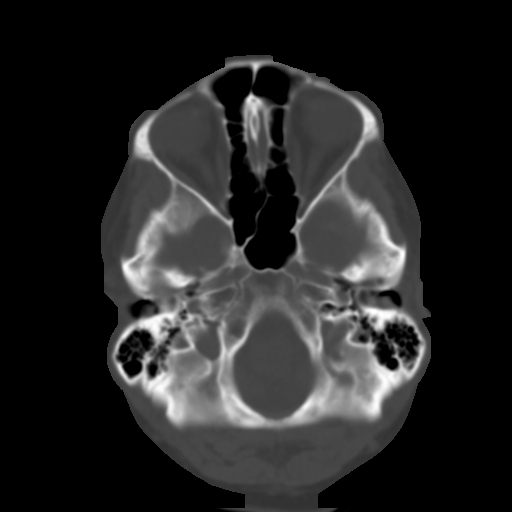
[im 5/28  brain]
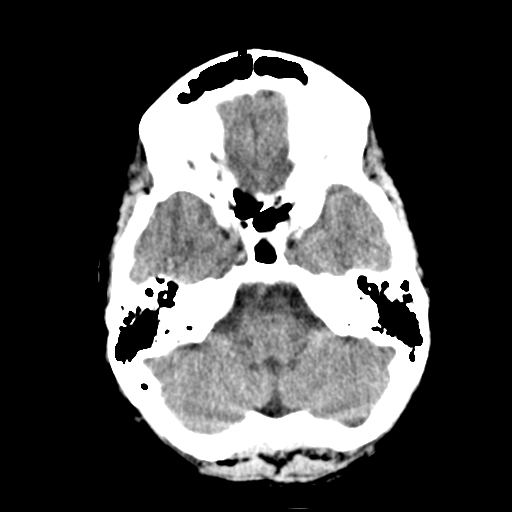
[im 8/28  brain]
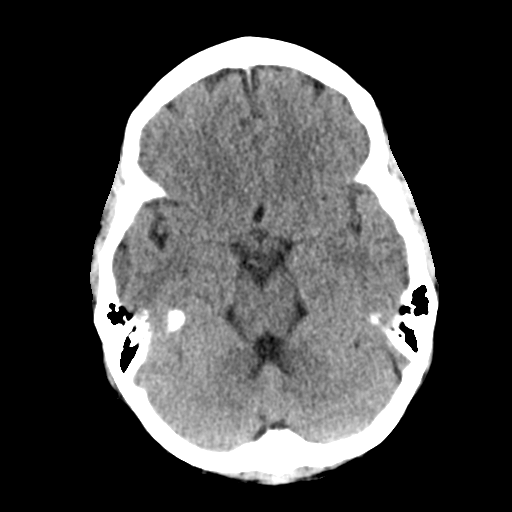
[im 11/28  brain]
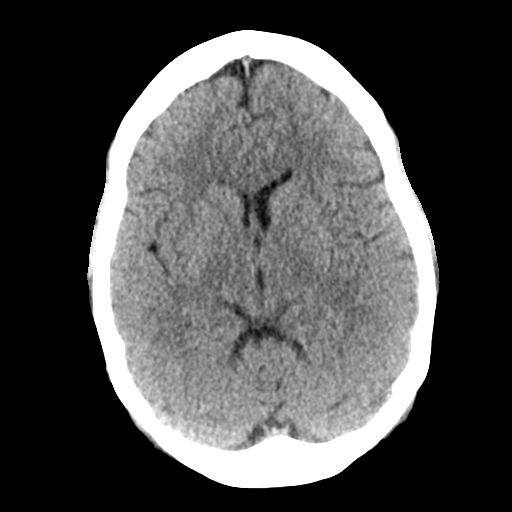
[im 13/28  brain]
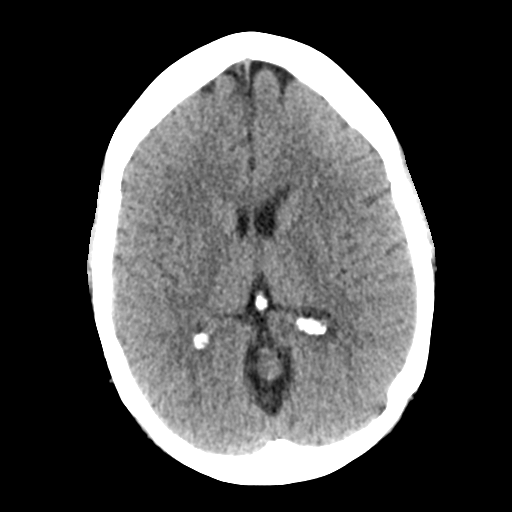
[im 13/28  bone]
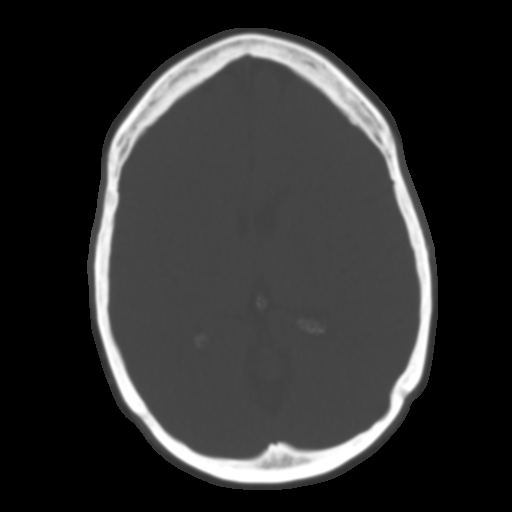
[im 16/28  brain]
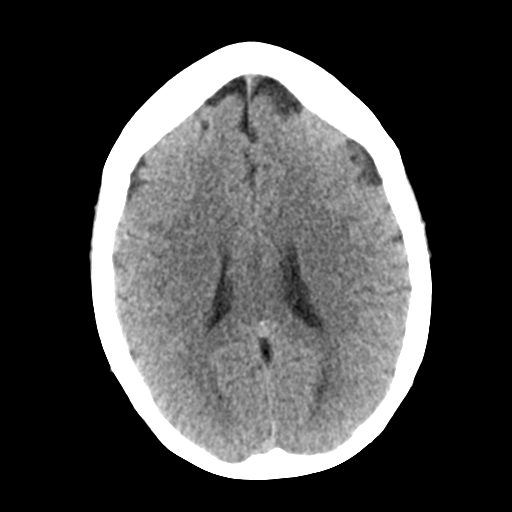
[im 18/28  brain]
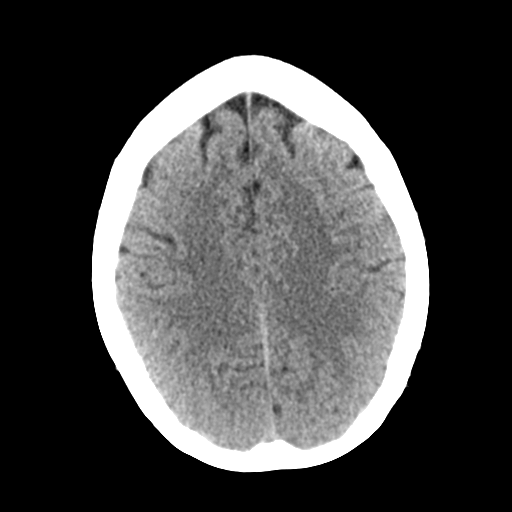
[im 21/28  brain]
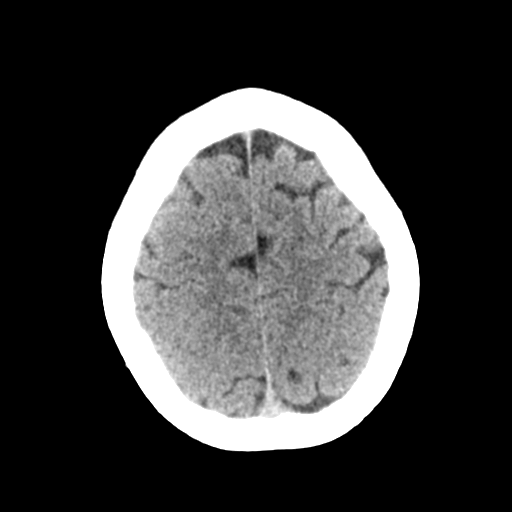
[im 24/28  brain]
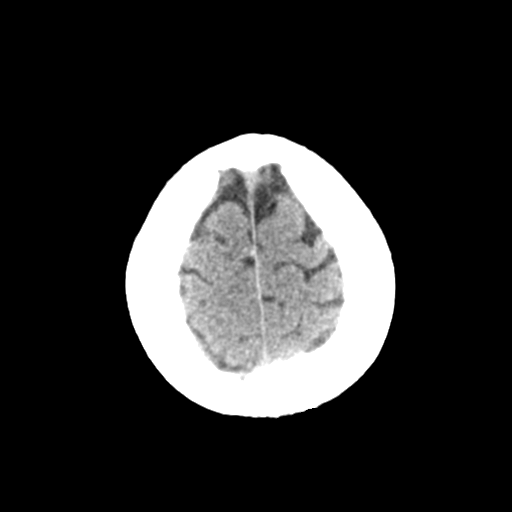
[im 24/28  bone]
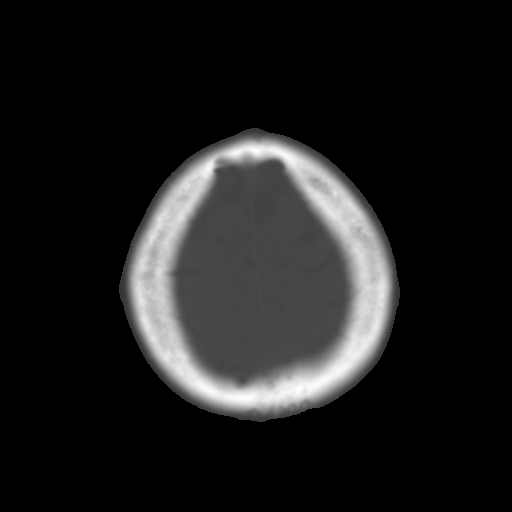
[im 26/28  brain]
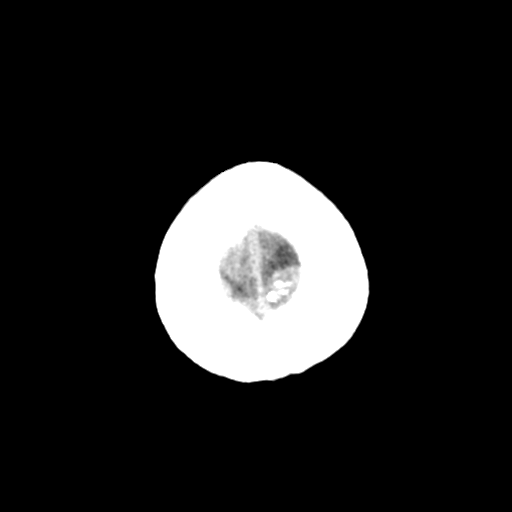

[Series 4: coronal soft tissue · coronal · 0.31mm/px · 3 of 64 slices shown]
[im 22/64  brain]
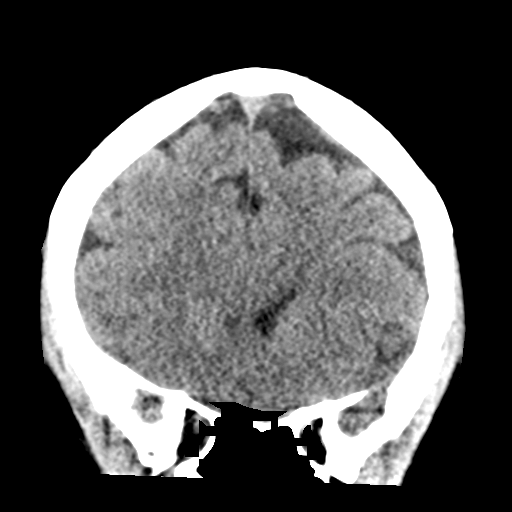
[im 29/64  brain]
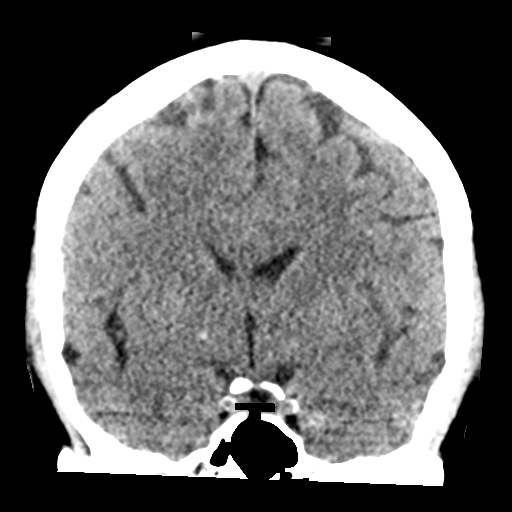
[im 36/64  brain]
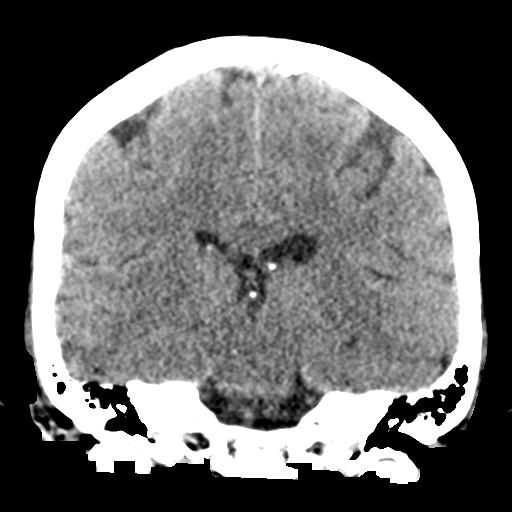

[Series 5: sagittal soft tissue · sagittal · 0.31mm/px · 3 of 50 slices shown]
[im 17/50  brain]
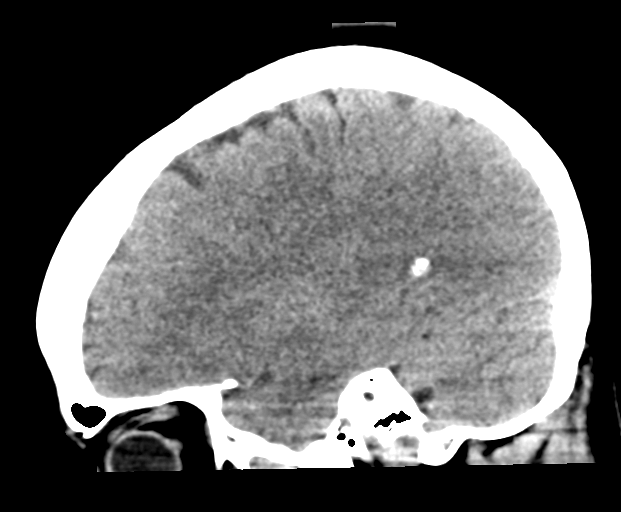
[im 25/50  brain]
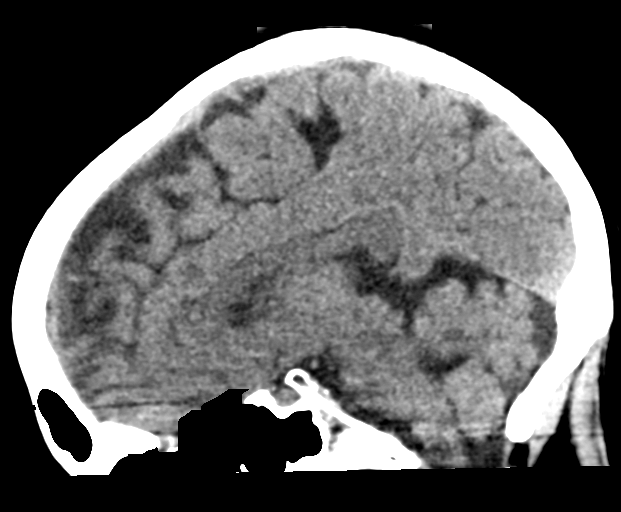
[im 33/50  brain]
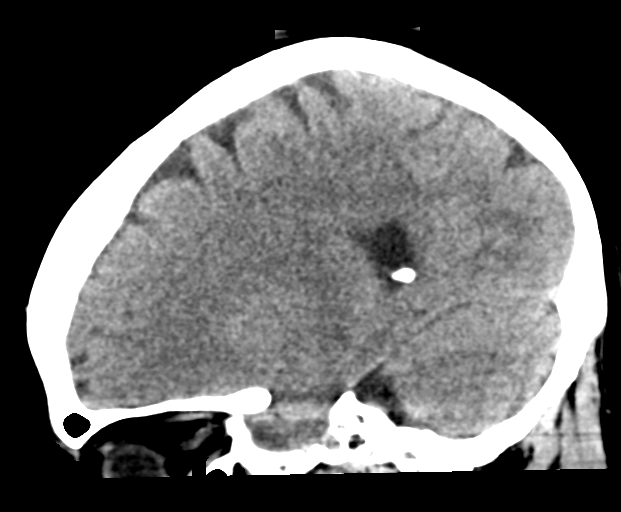

[16 of 45 positions shown; findings below may reference images not displayed]

FINDINGS: Brain: No evidence of acute infarction, hemorrhage, hydrocephalus,
extra-axial collection or mass lesion/mass effect.

Vascular: No hyperdense vessel or unexpected calcification.

Skull: Normal. Negative for fracture or focal lesion.

Sinuses/Orbits: No acute finding.
IMPRESSION: Negative head CT.

## 2021-01-22 ENCOUNTER — Other Ambulatory Visit: Payer: Self-pay

## 2021-01-22 ENCOUNTER — Emergency Department
Admission: EM | Admit: 2021-01-22 | Discharge: 2021-01-22 | Disposition: A | Discharge status: Home / Self Care | Payer: BC Managed Care – PPO | Source: Home / Self Care | Attending: Family Medicine | Admitting: Family Medicine

## 2021-01-22 ENCOUNTER — Emergency Department (INDEPENDENT_AMBULATORY_CARE_PROVIDER_SITE_OTHER): Payer: BC Managed Care – PPO

## 2021-01-22 ENCOUNTER — Encounter: Payer: Self-pay | Admitting: *Deleted

## 2021-01-22 DIAGNOSIS — R059 Cough, unspecified: Secondary | ICD-10-CM

## 2021-01-22 DIAGNOSIS — J209 Acute bronchitis, unspecified: Secondary | ICD-10-CM | POA: Diagnosis not present

## 2021-01-22 DIAGNOSIS — R0602 Shortness of breath: Secondary | ICD-10-CM

## 2021-01-22 DIAGNOSIS — R079 Chest pain, unspecified: Secondary | ICD-10-CM | POA: Diagnosis not present

## 2021-01-22 DIAGNOSIS — R0989 Other specified symptoms and signs involving the circulatory and respiratory systems: Secondary | ICD-10-CM

## 2021-01-22 MED ORDER — DOXYCYCLINE HYCLATE 100 MG PO CAPS: ORAL_CAPSULE | ORAL | 0 refills | Status: DC

## 2021-01-22 MED ORDER — AZITHROMYCIN 250 MG PO TABS: ORAL_TABLET | ORAL | 0 refills | Status: AC

## 2021-01-22 NOTE — ED Provider Notes (Signed)
[facility]    CSN: [id number] Arrival date & time: 01/22/21  1038      History   Chief Complaint Chief Complaint  Patient presents with   Cough   Chest Pain    Rib Pain right    HPI Nancy Stevenson is a 56 y.o. female.   Patient states that she developed mild cold-like symptoms about 3 weeks ago.  Her son had a similar illness at that time that only lasted several days.  She states that her sinus congestion resolved while her non-productive cough has persisted.  She had a daily low grade fever until two days ago.  She recently developed pleuritic pain in her left lateral inferior chest.  She has persistent fatigue and mild shortness of breath with activity.  The history is provided by the patient and the spouse.   Past Medical History:  Diagnosis Date   Compression fracture of lumbar vertebra ([place]) 03/2011   L2   Seasonal allergies    summer    There are no problems to display for this patient.   Past Surgical History:  Procedure Laterality Date   CESAREAN SECTION     x4   ESSURE TUBAL LIGATION  06/2007   Dr. Willis Modena   WISDOM TOOTH EXTRACTION      OB History     Gravida  4   Para  4   Term      Preterm      AB      Living  5      SAB      IAB      Ectopic      Multiple      Live Births           Obstetric Comments  Twins with first pregnancy          Home Medications    Prior to Admission medications   Medication Sig Start Date End Date Taking? Authorizing Provider  azithromycin (ZITHROMAX Z-PAK) 250 MG tablet Take 2 tabs today; then begin one tab once daily for 4 more days. 01/22/21  Yes Kandra Nicolas, MD  aspirin 81 MG EC tablet Take 1 tablet (81 mg total) by mouth daily. Swallow whole. 08/08/18   Earleen Newport, MD  Multiple Vitamin (MULITIVITAMIN WITH MINERALS) TABS Take 1 tablet by mouth daily.    [provider]  omeprazole (PRILOSEC) 40 MG capsule Take 1 capsule (40 mg total) by mouth daily.  10/10/17   Rita Ohara, MD    Family History Family History  Problem Relation Age of Onset   Stroke Mother    Hypertension Mother    Kidney disease Mother        dialysis   Heart disease Father 31       MI   Hypertension Father    Diabetes Paternal Aunt    Cancer Neg Hx     Social History Social History   Tobacco Use   Smoking status: Former    Packs/day: 0.20    Years: 10.00    Pack years: 2.00    Types: Cigarettes    Quit date: 01/07/2010    Years since quitting: 11.0   Smokeless tobacco: Never  Vaping Use   Vaping Use: Never used  Substance Use Topics   Alcohol use: Yes    Alcohol/week: 0.0 standard drinks    Comment: 0-1 drinks per week.   Drug use: No     Allergies   Other and Penicillins  Review of Systems Review of Systems + sore throat, resolved + cough + left pleuritic pain No wheezing No nasal congestion No post-nasal drainage No sinus pain/pressure No itchy/red eyes No earache No hemoptysis + SOB + fever (resolved) No nausea No vomiting No abdominal pain No diarrhea No urinary symptoms No skin rash + fatigue No myalgias No headache Used OTC meds (Theraflu and Coricidin) without relief   Physical Exam Triage Vital Signs ED Triage Vitals  Enc Vitals Group     BP 01/22/21 1150 (!) 141/82     Pulse Rate 01/22/21 1150 73     Resp 01/22/21 1150 16     Temp 01/22/21 1150 98 F (36.7 C)     Temp Source 01/22/21 1150 Oral     SpO2 01/22/21 1150 96 %     Weight 01/22/21 1154 250 lb (113.4 kg)     Height 01/22/21 1154 5\' 8"  (1.727 m)     Head Circumference --      Peak Flow --      Pain Score 01/22/21 1154 4     Pain Loc --      Pain Edu? --      Excl. in [place]? --    No data found.  Updated Vital Signs BP (!) 141/82 (BP Location: Right Arm)    Pulse 73    Temp 98 F (36.7 C) (Oral)    Resp 16    Ht 5\' 8"  (1.727 m)    Wt 113.4 kg    LMP 03/15/2017 (Approximate) Comment: tubal ligation   SpO2 96%    BMI 38.01 kg/m   Visual  Acuity Right Eye Distance:   Left Eye Distance:   Bilateral Distance:    Right Eye Near:   Left Eye Near:    Bilateral Near:     Physical Exam Nursing notes and Vital Signs reviewed. Appearance:  Patient appears stated age, and in no acute distress Eyes:  Pupils are equal, round, and reactive to light and accomodation.  Extraocular movement is intact.  Conjunctivae are not inflamed  Ears:  Right canal occluded with cerumen.  Left canal partly occluded with cerumen; visible portion of left tympanic membrane appears normal. Nose:  Mildly congested turbinates.  No sinus tenderness.    Pharynx:  Normal Neck:  Supple.  Mildly enlarged lateral nodes are present, tender to palpation on the left.   Lungs:  Clear to auscultation.  Breath sounds are equal.  Moving air well.  Left lateral inferior ribs are tender to palpation. Heart:  Regular rate and rhythm without murmurs, rubs, or gallops.  Abdomen:  Nontender without masses or hepatosplenomegaly.  Bowel sounds are present.  No CVA or flank tenderness.  Extremities:  No edema.  Skin:  No rash present.   UC Treatments / Results  Labs (all labs ordered are listed, but only abnormal results are displayed) Labs Reviewed - No data to display  EKG   Radiology DG Chest 2 View  Result Date: 01/22/2021 CLINICAL DATA:  Provided history: Shortness of breath, cough. Additional history provided: Coughing congestion and chest pain for 3 weeks. EXAM: CHEST - 2 VIEW COMPARISON:  Prior chest radiographs 09/15/2017 and earlier. FINDINGS: Heart size within normal limits. No appreciable airspace consolidation. Minimal linear atelectasis versus scarring within the lateral left lung base. No appreciable airspace consolidation. No evidence of pleural effusion or pneumothorax. No acute bony abnormality identified. IMPRESSION: Minimal linear atelectasis versus scarring within the lateral left lung base. Otherwise, no evidence of  acute cardiopulmonary abnormality.  Electronically Signed   By: Kellie Simmering D.O.   On: 01/22/2021 12:26    Procedures Procedures (including critical care time)  Medications Ordered in UC Medications - No data to display  Initial Impression / Assessment and Plan / UC Course  I have reviewed the triage vital signs and the nursing notes.  Pertinent labs & imaging results that were available during my care of the patient were reviewed by me and considered in my medical decision making (see chart for details).    Note minimal linear atelectasis vs scarring within the lateral left lung base.  Review of records reveals a normal chest x-ray done 09/15/17. Begin Z-pak. Followup with Family Doctor if not improved in about 5 days.  Final Clinical Impressions(s) / UC Diagnoses   Final diagnoses:  Acute bronchitis, unspecified organism     Discharge Instructions      Take plain guaifenesin (1200mg  extended release tabs such as Mucinex) twice daily, with plenty of water, for cough and congestion. Get adequate rest.   May take Delsym Cough Suppressant ("12 Hour Cough Relief") at bedtime for nighttime cough.  Stop all antihistamines (Coricidin, etc) for now, and other non-prescription cough/cold preparations. May take Ibuprofen 200mg , 4 tabs every 8 hours with food for chest discomfort. Begin Azithromycin if not improving about one week or if persistent fever develops (Given a prescription to hold, with an expiration date)  Follow-up with family doctor if not improving about10 days.     ED Prescriptions     Medication Sig Dispense Auth. Provider   doxycycline (VIBRAMYCIN) 100 MG capsule  (Status: Discontinued) Take one cap PO Q12hr with food. 14 capsule Kandra Nicolas, MD   azithromycin (ZITHROMAX Z-PAK) 250 MG tablet Take 2 tabs today; then begin one tab once daily for 4 more days. 6 tablet Kandra Nicolas, MD         Kandra Nicolas, MD 01/22/21 [phone]

## 2021-01-22 NOTE — ED Triage Notes (Signed)
Patient c/o 3 weeks of dry cough and low grade fever.  Fever resolved still has cough now right sided rib pain. SOB and fatigue.

## 2021-01-22 NOTE — Discharge Instructions (Signed)
Take plain guaifenesin (1200mg  extended release tabs such as Mucinex) twice daily, with plenty of water, for cough and congestion. Get adequate rest.   May take Delsym Cough Suppressant ("12 Hour Cough Relief") at bedtime for nighttime cough.  Stop all antihistamines (Coricidin, etc) for now, and other non-prescription cough/cold preparations. May take Ibuprofen 200mg , 4 tabs every 8 hours with food for chest discomfort. Begin Azithromycin if not improving about one week or if persistent fever develops (Given a prescription to hold, with an expiration date)  Follow-up with family doctor if not improving about10 days.

## 2021-08-02 ENCOUNTER — Other Ambulatory Visit: Payer: Self-pay | Admitting: Family Medicine

## 2021-08-02 DIAGNOSIS — Z1231 Encounter for screening mammogram for malignant neoplasm of breast: Secondary | ICD-10-CM

## 2021-08-18 ENCOUNTER — Ambulatory Visit: Payer: BC Managed Care – PPO

## 2021-09-01 ENCOUNTER — Ambulatory Visit: Payer: BC Managed Care – PPO

## 2022-02-03 ENCOUNTER — Encounter: Payer: Self-pay | Admitting: Internal Medicine

## 2022-02-03 ENCOUNTER — Ambulatory Visit: Payer: BC Managed Care – PPO | Admitting: Internal Medicine

## 2022-02-03 VITALS — BP 124/80 | HR 93 | Temp 97.2°F | Resp 18 | Ht 69.0 in | Wt 228.1 lb

## 2022-02-03 DIAGNOSIS — K047 Periapical abscess without sinus: Secondary | ICD-10-CM | POA: Insufficient documentation

## 2022-02-03 DIAGNOSIS — K029 Dental caries, unspecified: Secondary | ICD-10-CM | POA: Insufficient documentation

## 2022-02-03 MED ORDER — AMOXICILLIN 500 MG PO CAPS: 500.0000 mg | ORAL_CAPSULE | Freq: Three times a day (TID) | ORAL | 0 refills | Status: AC

## 2022-02-03 NOTE — Progress Notes (Signed)
Office Visit  Subjective   Patient ID: Nancy Stevenson   DOB: May 26, 1965   Age: 57 y.o.   MRN: [id number]   Chief Complaint Chief Complaint  Patient presents with   New Patient (Initial Visit)    Swollen Left Jaw     History of Present Illness The patient is a 57 yo female who comes in today with right lower jaw swelling with pain started yesterday.  She originally thought this was a tooth ache and started to take tylenol.  She was trying to see her dentist for this.  There has been no swelling of her lips or tongue.  She has a history of dental caries.     Past Medical History Past Medical History:  Diagnosis Date   Compression fracture of lumbar vertebra ([place]) 03/2011   L2   Seasonal allergies    summer     Allergies Allergies  Allergen Reactions   Penicillins Other (See Comments), Rash and Hives    Fever  Has patient had a PCN reaction causing immediate rash, facial/tongue/throat swelling, SOB or lightheadedness with hypotension: Yes  Has patient had a PCN reaction causing severe rash involving mucus membranes or skin necrosis: No  Has patient had a PCN reaction that required hospitalization: No  Has patient had a PCN reaction occurring within the last 10 years: No  If all of the above answers are "NO", then may proceed with Cephalosporin use.   Other Other (See Comments)    NO NARCOTICS - patient preference     Medications  Current Outpatient Medications:    aspirin 81 MG EC tablet, Take 1 tablet (81 mg total) by mouth daily. Swallow whole., Disp: 30 tablet, Rfl: 12   azithromycin (ZITHROMAX Z-PAK) 250 MG tablet, Take 2 tabs today; then begin one tab once daily for 4 more days., Disp: 6 tablet, Rfl: 0   Multiple Vitamin (MULITIVITAMIN WITH MINERALS) TABS, Take 1 tablet by mouth daily., Disp: , Rfl:    omeprazole (PRILOSEC) 40 MG capsule, Take 1 capsule (40 mg total) by mouth daily., Disp: 30 capsule, Rfl: 1   Review of Systems Review of Systems  Constitutional:   Negative for chills and fever.  HENT:  Negative for congestion, ear pain, hearing loss, sinus pain and sore throat.   Eyes:  Negative for blurred vision and double vision.  Respiratory:  Negative for cough, shortness of breath and wheezing.   Gastrointestinal:  Negative for abdominal pain, constipation, diarrhea, nausea and vomiting.  Musculoskeletal:  Negative for myalgias.  Skin:  Negative for itching and rash.  Neurological:  Negative for dizziness, weakness and headaches.       Objective:    Vitals BP 124/80 (BP Location: Left Arm, Patient Position: Sitting, Cuff Size: Normal)   Pulse 93   Temp (!) 97.2 F (36.2 C)   Resp 18   Ht 5\' 9"  (1.753 m)   Wt 228 lb 2 oz (103.5 kg)   LMP 03/15/2017 (Approximate) Comment: tubal ligation  SpO2 97%   BMI 33.69 kg/m    Physical Examination Physical Exam Constitutional:      Appearance: Normal appearance. She is not ill-appearing.  HENT:     Head: Normocephalic and atraumatic.     Right Ear: Tympanic membrane, ear canal and external ear normal.     Left Ear: Tympanic membrane, ear canal and external ear normal.     Nose: Nose normal. No congestion or rhinorrhea.     Mouth/Throat:     Mouth:  Mucous membranes are moist.     Pharynx: Oropharynx is clear. No oropharyngeal exudate or posterior oropharyngeal erythema.  Neck:     Vascular: No carotid bruit.     Comments: She has multiple dental caries and she has swelling in her left lower jaw at teeth line.  I feel no fluctuance/abscess. Cardiovascular:     Rate and Rhythm: Normal rate and regular rhythm.     Pulses: Normal pulses.     Heart sounds: No murmur heard.    No friction rub. No gallop.  Musculoskeletal:     Cervical back: Neck supple. Tenderness present.  Lymphadenopathy:     Cervical: No cervical adenopathy.  Neurological:     Mental Status: She is alert.        Assessment & Plan:   Infected tooth She has multiple caries and I recommend her to see her dentist.   We will start her on amoxicillin.  She is penicillin allergic but she states she has taken amoxicillin before without problems.  I think her swelling and pain is from dental infection with her caries.  I want her to also use ibuprofen 400-600mg  TID x 3-5 days.    No follow-ups on file.   Townsend Roger, MD

## 2022-02-03 NOTE — Assessment & Plan Note (Signed)
She has multiple caries and I recommend her to see her dentist.  We will start her on amoxicillin.  She is penicillin allergic but she states she has taken amoxicillin before without problems.  I think her swelling and pain is from dental infection with her caries.  I want her to also use ibuprofen 400-600mg  TID x 3-5 days.

## 2022-05-08 ENCOUNTER — Ambulatory Visit (HOSPITAL_COMMUNITY)
Admission: EM | Admit: 2022-05-08 | Discharge: 2022-05-08 | Disposition: A | Discharge status: Home / Self Care | Payer: BC Managed Care – PPO | Attending: Emergency Medicine | Admitting: Emergency Medicine

## 2022-05-08 ENCOUNTER — Encounter (HOSPITAL_COMMUNITY): Payer: Self-pay

## 2022-05-08 DIAGNOSIS — L089 Local infection of the skin and subcutaneous tissue, unspecified: Secondary | ICD-10-CM

## 2022-05-08 MED ORDER — MUPIROCIN 2 % EX OINT: 1.0000 | TOPICAL_OINTMENT | Freq: Two times a day (BID) | CUTANEOUS | 0 refills | Status: AC

## 2022-05-08 MED ORDER — AMOXICILLIN-POT CLAVULANATE 875-125 MG PO TABS: 1.0000 | ORAL_TABLET | Freq: Two times a day (BID) | ORAL | 0 refills | Status: AC

## 2022-05-08 NOTE — ED Triage Notes (Signed)
Here for R- hand middle finger infection x 1 week.

## 2022-05-08 NOTE — Discharge Instructions (Addendum)
Please take medication as prescribed. Take with food to avoid upset stomach. Finish the full course!  Soak the finger in warm water for 10-15 minutes at a time, a few times daily.  Apply the ointment twice daily  Monitor for any worsening symptoms and please return if needed

## 2022-05-08 NOTE — ED Provider Notes (Signed)
[facility]    CSN: [id number] Arrival date & time: 05/08/22  1531      History   Chief Complaint Chief Complaint  Patient presents with   Finger Injury    HPI Nancy Stevenson is a 57 y.o. female.  Here with over a week of left hand middle finger infection Started under her acrylic nail and spread to base of nail. Red, swollen, draining. Painful to touch.   No fevers Does not bite nails No history of this  Past Medical History:  Diagnosis Date   Compression fracture of lumbar vertebra ([facility]) 03/2011   L2   Seasonal allergies    summer    Patient Active Problem List   Diagnosis Date Noted   Dental caries 02/03/2022   Infected tooth 02/03/2022    Past Surgical History:  Procedure Laterality Date   CESAREAN SECTION     x4   ESSURE TUBAL LIGATION  06/2007   Dr. Jackelyn Knife   WISDOM TOOTH EXTRACTION      OB History     Gravida  4   Para  4   Term      Preterm      AB      Living  5      SAB      IAB      Ectopic      Multiple      Live Births           Obstetric Comments  Twins with first pregnancy          Home Medications    Prior to Admission medications   Medication Sig Start Date End Date Taking? Authorizing Provider  amoxicillin-clavulanate (AUGMENTIN) 875-125 MG tablet Take 1 tablet by mouth every 12 (twelve) hours for 7 days. 05/08/22 05/15/22 Yes Ghina Bittinger, Lurena Joiner, PA-C  mupirocin ointment (BACTROBAN) 2 % Apply 1 Application topically 2 (two) times daily. 05/08/22  Yes Zahava Quant, Ray Church  aspirin 81 MG EC tablet Take 1 tablet (81 mg total) by mouth daily. Swallow whole. 08/08/18   Emily Filbert, MD  azithromycin (ZITHROMAX Z-PAK) 250 MG tablet Take 2 tabs today; then begin one tab once daily for 4 more days. 01/22/21   Lattie Haw, MD  Multiple Vitamin (MULITIVITAMIN WITH MINERALS) TABS Take 1 tablet by mouth daily.    [provider]  omeprazole (PRILOSEC) 40 MG capsule Take 1 capsule (40 mg total) by  mouth daily. 10/10/17   Joselyn Arrow, MD    Family History Family History  Problem Relation Age of Onset   Stroke Mother    Hypertension Mother    Kidney disease Mother        dialysis   Heart disease Father 79       MI   Hypertension Father    Diabetes Paternal Aunt    Cancer Neg Hx     Social History Social History   Tobacco Use   Smoking status: Former    Packs/day: 0.20    Years: 10.00    Additional pack years: 0.00    Total pack years: 2.00    Types: Cigarettes    Quit date: 01/07/2010    Years since quitting: 12.3   Smokeless tobacco: Never  Vaping Use   Vaping Use: Never used  Substance Use Topics   Alcohol use: Yes    Alcohol/week: 0.0 standard drinks of alcohol    Comment: 0-1 drinks per week.   Drug use: No  Allergies   Other   Review of Systems Review of Systems As per HPI  Physical Exam Triage Vital Signs ED Triage Vitals [05/08/22 1631]  Enc Vitals Group     BP 135/69     Pulse Rate 82     Resp 19     Temp 98.7 F (37.1 C)     Temp Source Oral     SpO2 98 %     Weight      Height      Head Circumference      Peak Flow      Pain Score      Pain Loc      Pain Edu?      Excl. in [place]?    No data found.  Updated Vital Signs BP 135/69 (BP Location: Left Arm)   Pulse 82   Temp 98.7 F (37.1 C) (Oral)   Resp 19   LMP 03/15/2017 (Approximate) Comment: tubal ligation  SpO2 98%    Physical Exam Vitals and nursing note reviewed.  Constitutional:      General: She is not in acute distress. HENT:     Mouth/Throat:     Pharynx: Oropharynx is clear.  Cardiovascular:     Rate and Rhythm: Normal rate and regular rhythm.     Pulses: Normal pulses.  Pulmonary:     Effort: Pulmonary effort is normal.  Musculoskeletal:        General: Normal range of motion.  Skin:    Findings: Erythema present.     Comments: Paronychia of right hand middle finger. There is some pus white/yellow drainage from the side of the nail. Just below base of  fingernail area is erythematous and swollen. No fluctuance. Tender to light touch.   Neurological:     Mental Status: She is alert and oriented to person, place, and time.    UC Treatments / Results  Labs (all labs ordered are listed, but only abnormal results are displayed) Labs Reviewed - No data to display  EKG   Radiology No results found.  Procedures Procedures (including critical care time)  Medications Ordered in UC Medications - No data to display  Initial Impression / Assessment and Plan / UC Course  I have reviewed the triage vital signs and the nursing notes.  Pertinent labs & imaging results that were available during my care of the patient were reviewed by me and considered in my medical decision making (see chart for details).   Originally had PCN allergy in chart. Patient reports as a child she had a rash and was told to report this as an allergy. However she has taken amoxicillin several times without problems. Last use was February for a tooth infection. She denies any side effects. With patient permission, removed allergy from the chart.   Paronychia Augmentin BID x 7 days Mupirocin BID topically Recommend warm soaks, remove acrylic nail, monitor for worsening or no improvement of symptoms.  Return precautions discussed. Patient agrees to plan  Final Clinical Impressions(s) / UC Diagnoses   Final diagnoses:  Finger infection     Discharge Instructions      Please take medication as prescribed. Take with food to avoid upset stomach. Finish the full course!  Soak the finger in warm water for 10-15 minutes at a time, a few times daily.  Apply the ointment twice daily  Monitor for any worsening symptoms and please return if needed     ED Prescriptions     Medication  Sig Dispense Auth. Provider   amoxicillin-clavulanate (AUGMENTIN) 875-125 MG tablet Take 1 tablet by mouth every 12 (twelve) hours for 7 days. 14 tablet Delaine Hernandez, PA-C    mupirocin ointment (BACTROBAN) 2 % Apply 1 Application topically 2 (two) times daily. 15 g Nadege Carriger, Lurena Joiner, PA-C      PDMP not reviewed this encounter.   Raeanna Soberanes, Ray Church 05/08/22 Ernestina [place]
